# Patient Record
Sex: Female | Born: 1995 | Race: Black or African American | Hispanic: No | Marital: Single | Smoking: Never smoker
Health system: Southern US, Community
[De-identification: ages and names within clinical notes are randomized; demographics above are authoritative.]

## PROBLEM LIST (undated history)

## (undated) ENCOUNTER — Ambulatory Visit: Admission: EM | Source: Home / Self Care

## (undated) DIAGNOSIS — Z789 Other specified health status: Secondary | ICD-10-CM

## (undated) HISTORY — PX: NO PAST SURGERIES: SHX2092

## (undated) HISTORY — DX: Other specified health status: Z78.9

---
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
## 2018-12-01 ENCOUNTER — Other Ambulatory Visit: Payer: Self-pay

## 2018-12-01 DIAGNOSIS — Z20822 Contact with and (suspected) exposure to covid-19: Secondary | ICD-10-CM

## 2018-12-06 LAB — NOVEL CORONAVIRUS, NAA: SARS-CoV-2, NAA: NOT DETECTED

## 2019-06-15 ENCOUNTER — Other Ambulatory Visit: Payer: Self-pay

## 2019-06-15 ENCOUNTER — Ambulatory Visit
Admission: EM | Admit: 2019-06-15 | Discharge: 2019-06-15 | Disposition: A | Discharge status: Home / Self Care | Payer: Self-pay | Attending: Emergency Medicine | Admitting: Emergency Medicine

## 2019-06-15 DIAGNOSIS — Z20822 Contact with and (suspected) exposure to covid-19: Secondary | ICD-10-CM

## 2019-06-15 NOTE — ED Provider Notes (Signed)
[facility]   [id number] 06/15/19 Arrival Time: 1743   CC: COVID exposure  SUBJECTIVE: History from: patient.  Brittany Mayo is a 24 y.o. female who presents for COVID testing.  Admits to positive exposure at home.  Denies recent travel.  Denies aggravating or alleviating symptoms.  Denies previous COVID infection.   Denies fever, chills, fatigue, nasal congestion, rhinorrhea, sore throat, cough, SOB, wheezing, chest pain, nausea, vomiting, changes in bowel or bladder habits.    ROS: As per HPI.  All other pertinent ROS negative.     History reviewed. No pertinent past medical history. History reviewed. No pertinent surgical history. Allergies  Allergen Reactions  . Peanut-Containing Drug Products Hives and Rash   No current facility-administered medications on file prior to encounter.   Current Outpatient Medications on File Prior to Encounter  Medication Sig Dispense Refill  . ibuprofen (ADVIL,MOTRIN) 600 MG tablet Take 1 tablet (600 mg total) by mouth every 6 (six) hours as needed for pain. 30 tablet 0   Social History   Socioeconomic History  . Marital status: Single    Spouse name: Not on file  . Number of children: Not on file  . Years of education: Not on file  . Highest education level: Not on file  Occupational History  . Not on file  Tobacco Use  . Smoking status: Never Smoker  . Smokeless tobacco: Never Used  Substance and Sexual Activity  . Alcohol use: No  . Drug use: No  . Sexual activity: Not on file  Other Topics Concern  . Not on file  Social History Narrative  . Not on file   Social Determinants of Health   Financial Resource Strain:   . Difficulty of Paying Living Expenses: Not on file  Food Insecurity:   . Worried About Programme researcher, broadcasting/film/video in the Last Year: Not on file  . Ran Out of Food in the Last Year: Not on file  Transportation Needs:   . Lack of Transportation (Medical): Not on file  . Lack of Transportation (Non-Medical):  Not on file  Physical Activity:   . Days of Exercise per Week: Not on file  . Minutes of Exercise per Session: Not on file  Stress:   . Feeling of Stress : Not on file  Social Connections:   . Frequency of Communication with Friends and Family: Not on file  . Frequency of Social Gatherings with Friends and Family: Not on file  . Attends Religious Services: Not on file  . Active Member of Clubs or Organizations: Not on file  . Attends Banker Meetings: Not on file  . Marital Status: Not on file  Intimate Partner Violence:   . Fear of Current or Ex-Partner: Not on file  . Emotionally Abused: Not on file  . Physically Abused: Not on file  . Sexually Abused: Not on file   Family History  Problem Relation Age of Onset  . Healthy Mother   . Healthy Father     OBJECTIVE:  Vitals:   06/15/19 1800  BP: 123/84  Pulse: 80  Resp: 16  Temp: 99.2 F (37.3 C)  TempSrc: Oral  SpO2: 97%    General appearance: alert; well-appearing, nontoxic; speaking in full sentences and tolerating own secretions HEENT: NCAT; Ears: EACs clear, TMs pearly gray; Eyes: PERRL.  EOM grossly intact.Nose: nares patent without rhinorrhea, Throat: oropharynx clear, tonsils non erythematous or enlarged, uvula midline  Neck: supple without LAD Lungs: unlabored respirations, symmetrical  air entry; cough: absent; no respiratory distress; CTAB Heart: regular rate and rhythm.  Skin: warm and dry Psychological: alert and cooperative; normal mood and affect  ASSESSMENT & PLAN:  1. Exposure to COVID-19 virus   2. Suspected COVID-19 virus infection    COVID testing ordered.  It will take between 5-7 days for test results.  Someone will contact you regarding abnormal results.    In the meantime: You should remain isolated in your home for 10 days from symptom onset AND greater than 72 hours after symptoms resolution (absence of fever without the use of fever-reducing medication and improvement in  respiratory symptoms), whichever is longer OR 14 days from exposure Get plenty of rest and push fluids Use OTC zyrtec for nasal congestion, runny nose, and/or sore throat Use OTC flonase for nasal congestion and runny nose Use OTC medications like ibuprofen or tylenol as needed fever or pain Call or go to the ED if you have any new or worsening symptoms such as fever, cough, shortness of breath, chest tightness, chest pain, turning blue, changes in mental status, etc...   Reviewed expectations re: course of current medical issues. Questions answered. Outlined signs and symptoms indicating need for more acute intervention. Patient verbalized understanding. After Visit Summary given.         Lestine Box, PA-C 06/15/19 1825

## 2019-06-15 NOTE — ED Triage Notes (Signed)
Pt presents to [facility] for covid test. Pt states she was exposed to covid positive brothers who live in same house. Pt denies symptoms at this time.

## 2019-06-15 NOTE — Discharge Instructions (Signed)
COVID testing ordered.  It will take between 5-7 days for test results.  Someone will contact you regarding abnormal results.   ° °In the meantime: °You should remain isolated in your home for 10 days from symptom onset AND greater than 72 hours after symptoms resolution (absence of fever without the use of fever-reducing medication and improvement in respiratory symptoms), whichever is longer °OR 14 days from exposure °Get plenty of rest and push fluids °Use OTC zyrtec for nasal congestion, runny nose, and/or sore throat °Use OTC flonase for nasal congestion and runny nose °Use OTC medications like ibuprofen or tylenol as needed fever or pain °Call or go to the ED if you have any new or worsening symptoms such as fever, cough, shortness of breath, chest tightness, chest pain, turning blue, changes in mental status, etc...  °

## 2019-06-16 LAB — NOVEL CORONAVIRUS, NAA: SARS-CoV-2, NAA: NOT DETECTED

## 2020-02-18 ENCOUNTER — Encounter: Payer: Self-pay | Admitting: Advanced Practice Midwife

## 2020-02-21 ENCOUNTER — Encounter: Payer: Self-pay | Admitting: Women's Health

## 2020-02-21 ENCOUNTER — Other Ambulatory Visit: Payer: Self-pay

## 2020-02-21 ENCOUNTER — Ambulatory Visit (INDEPENDENT_AMBULATORY_CARE_PROVIDER_SITE_OTHER): Payer: No Typology Code available for payment source | Admitting: Women's Health

## 2020-02-21 VITALS — BP 126/82 | HR 86 | Ht 64.0 in | Wt 211.6 lb

## 2020-02-21 DIAGNOSIS — Z3202 Encounter for pregnancy test, result negative: Secondary | ICD-10-CM | POA: Diagnosis not present

## 2020-02-21 DIAGNOSIS — Z30011 Encounter for initial prescription of contraceptive pills: Secondary | ICD-10-CM | POA: Diagnosis not present

## 2020-02-21 LAB — POCT URINE PREGNANCY: Preg Test, Ur: NEGATIVE

## 2020-02-21 MED ORDER — LO LOESTRIN FE 1 MG-10 MCG / 10 MCG PO TABS
1.0000 | ORAL_TABLET | Freq: Every day | ORAL | 3 refills | Status: DC
Start: 2020-02-21 — End: 2020-04-21

## 2020-02-21 NOTE — Progress Notes (Signed)
   GYN VISIT Patient name: Brittany Mayo MRN [id number]  Date of birth: March 17, 1996 Chief Complaint:   Discuss Birth Control  History of Present Illness:   Brittany Mayo is a 24 y.o. G0P0000 African American female being seen today to discuss getting on birth control. Wants to do pills, has done in the past and did well w/ them. Does not smoke, no h/o HTN, DVT/PE, CVA, MI, or migraines w/ aura. First bp was elevated today, normal on repeat.     Depression screen [facility] 2/9 02/21/2020  Decreased Interest 0  Down, Depressed, Hopeless 0  PHQ - 2 Score 0  Altered sleeping 1  Tired, decreased energy 1  Change in appetite 0  Feeling bad or failure about yourself  0  Trouble concentrating 0  Moving slowly or fidgety/restless 0  Suicidal thoughts 0  PHQ-9 Score 2  Difficult doing work/chores Not difficult at all    Patient's last menstrual period was 02/01/2020 (exact date). The current method of family planning is none.  Last pap never. Results were:  n/a Review of Systems:   Pertinent items are noted in HPI Denies fever/chills, dizziness, headaches, visual disturbances, fatigue, shortness of breath, chest pain, abdominal pain, vomiting, abnormal vaginal discharge/itching/odor/irritation, problems with periods, bowel movements, urination, or intercourse unless otherwise stated above.  Pertinent History Reviewed:  Reviewed past medical,surgical, social, obstetrical and family history.  Reviewed problem list, medications and allergies. Physical Assessment:   Vitals:   02/21/20 1155 02/21/20 1200  BP: (!) 142/73 126/82  Pulse: 84 86  Weight: 211 lb 9.6 oz (96 kg)   Height: 5\' 4"  (1.626 m)   Body mass index is 36.32 kg/m.       Physical Examination:   General appearance: alert, well appearing, and in no distress  Mental status: alert, oriented to person, place, and time  Skin: warm & dry   Cardiovascular: normal heart rate noted  Respiratory: normal respiratory effort, no  distress  Abdomen: soft, non-tender   Pelvic: examination not indicated  Extremities: no edema   Chaperone: n/a    Results for orders placed or performed in visit on 02/21/20 (from the past 24 hour(s))  POCT urine pregnancy   Collection Time: 02/21/20 11:59 AM  Result Value Ref Range   Preg Test, Ur Negative Negative    Assessment & Plan:  1) Contraception management> rx LoLoestrin 3pk w/ 3RF, gave 3 sample packs, condoms x 2wks, f/u in 02/23/20  2) Needs pap> will do at med check up  Meds:  Meds ordered this encounter  Medications  . LO LOESTRIN FE 1 MG-10 MCG / 10 MCG tablet    Sig: Take 1 tablet by mouth daily.    Dispense:  90 tablet    Refill:  3    For co-pay card, pt to text "Lo Loestrin Fe " to [phone]              Co-pay card must be run in second position  "other coverage code 3"  if denied d/t PA, step edit, or insurance denial    Order Specific Question:   Supervising Provider    Answer:   [id number] H [[id number]]    Orders Placed This Encounter  Procedures  . POCT urine pregnancy    Return in about 3 months (around 05/22/2020) for Pap & physical.  05/24/2020 CNM, [facility] 02/21/2020 12:09 PM

## 2020-04-21 ENCOUNTER — Other Ambulatory Visit: Payer: Self-pay

## 2020-04-21 ENCOUNTER — Ambulatory Visit (INDEPENDENT_AMBULATORY_CARE_PROVIDER_SITE_OTHER): Payer: No Typology Code available for payment source

## 2020-04-21 VITALS — BP 153/83 | HR 83 | Ht 64.0 in | Wt 220.0 lb

## 2020-04-21 DIAGNOSIS — N926 Irregular menstruation, unspecified: Secondary | ICD-10-CM

## 2020-04-21 LAB — POCT URINE PREGNANCY: Preg Test, Ur: POSITIVE — AB

## 2020-04-21 NOTE — Progress Notes (Addendum)
   NURSE VISIT- PREGNANCY CONFIRMATION   SUBJECTIVE:  Brittany Mayo is a 24 y.o. G1P0000 female at [redacted]w[redacted]d by certain LMP of Patient's last menstrual period was 03/02/2020 (exact date). Here for pregnancy confirmation.  Home pregnancy test: positive x 4  She reports nausea.  She is taking prenatal vitamins.    OBJECTIVE:  BP (!) 153/83 (BP Location: Left Arm, Patient Position: Sitting, Cuff Size: Normal)   Pulse 83   Ht 5\' 4"  (1.626 m)   Wt 220 lb (99.8 kg)   LMP 03/02/2020 (Exact Date)   BMI 37.76 kg/m   Appears well, in no apparent distress OB History  Gravida Para Term Preterm AB Living  1 0 0 0 0 0  SAB TAB Ectopic Multiple Live Births  0 0 0 0 0    # Outcome Date GA Lbr Len/2nd Weight Sex Delivery Anes PTL Lv  1 Current             Results for orders placed or performed in visit on 04/21/20 (from the past 24 hour(s))  POCT urine pregnancy   Collection Time: 04/21/20 10:18 AM  Result Value Ref Range   Preg Test, Ur Positive (A) Negative    ASSESSMENT: Positive pregnancy test, [redacted]w[redacted]d by LMP    PLAN: Schedule for dating ultrasound in 1 week Prenatal vitamins: continue   Nausea medicines: not currently needed   OB packet given: Yes  Solei Wubben A Ysidra Sopher  04/21/2020 10:21 AM   Attestation of Attending Supervision of Nursing Visit Encounter: Evaluation and management procedures were performed by the nursing staff under my supervision and collaboration.  I have reviewed the nurse's note and chart, and I agree with the management and plan.  04/23/2020 MD Attending Physician for the Center for [facility] 04/23/2020 10:46 AM

## 2020-04-24 ENCOUNTER — Other Ambulatory Visit: Payer: Self-pay

## 2020-04-24 ENCOUNTER — Other Ambulatory Visit: Payer: Self-pay | Admitting: Obstetrics & Gynecology

## 2020-04-24 ENCOUNTER — Ambulatory Visit (INDEPENDENT_AMBULATORY_CARE_PROVIDER_SITE_OTHER): Payer: No Typology Code available for payment source

## 2020-04-24 DIAGNOSIS — Z3A01 Less than 8 weeks gestation of pregnancy: Secondary | ICD-10-CM | POA: Diagnosis not present

## 2020-04-24 DIAGNOSIS — O3680X0 Pregnancy with inconclusive fetal viability, not applicable or unspecified: Secondary | ICD-10-CM

## 2020-04-24 NOTE — Progress Notes (Signed)
[place] 6+3 wks,single IUP with YS,crl 6.02 mm,fhr 125 bpm,normal ovaries

## 2020-05-07 ENCOUNTER — Other Ambulatory Visit: Payer: No Typology Code available for payment source

## 2020-05-24 NOTE — L&D Delivery Note (Signed)
OB/GYN Faculty Practice Delivery Note  Brittany Mayo is a 25 y.o. G1P0000 s/p SVD at [redacted]w[redacted]d. She was admitted for IOL for gHTN.   ROM: 3h 43m with clear fluid GBS Status:  Negative/-- (07/04 0000) Maximum Maternal Temperature: 98.2  Labor Progress: Initial SVE: 2cm. She had negative CST and was uptitrated on pitocin. AROM for clear fluid. She then progressed to complete.   Delivery Date/Time: 21:37 on 7/4  Delivery: Called to room and patient was complete and pushing. Head delivered LOA. No nuchal cord present. Shoulder and body delivered in usual fashion. Infant with spontaneous cry, placed on mother's abdomen, dried and stimulated. Cord clamped x 2 after 1-minute delay, and cut by patient's best friend. Cord blood drawn. Placenta delivered spontaneously with gentle cord traction. Fundus firm with massage and Pitocin. Labia, perineum, vagina, and cervix inspected inspected with second degree laceration and right labial laceration, repaired. Due to persistent oozing from laceration, TXA given and vagina packed with one lap. On reassessment bleeding had improved. Arrista placed over laceration.  Baby Weight: pending  Placenta: Sent to L&D Complications: None Lacerations: Second degree perineal, right labial, both repaired.  EBL: 200  mL Analgesia: Epidural   Infant:  APGAR (1 MIN): 8   APGAR (5 MINS): 9   APGAR (10 MINS):     Casper Harrison, MD [facility] Family Medicine Fellow, [facility] for [facility], [facility] 11/24/2020, 10:16 PM

## 2020-06-02 ENCOUNTER — Other Ambulatory Visit: Payer: Self-pay | Admitting: Women's Health

## 2020-06-02 ENCOUNTER — Other Ambulatory Visit: Payer: Self-pay | Admitting: Obstetrics & Gynecology

## 2020-06-02 DIAGNOSIS — Z3682 Encounter for antenatal screening for nuchal translucency: Secondary | ICD-10-CM

## 2020-06-03 ENCOUNTER — Other Ambulatory Visit: Payer: Self-pay

## 2020-06-03 ENCOUNTER — Ambulatory Visit (INDEPENDENT_AMBULATORY_CARE_PROVIDER_SITE_OTHER): Payer: No Typology Code available for payment source | Admitting: Women's Health

## 2020-06-03 ENCOUNTER — Ambulatory Visit (INDEPENDENT_AMBULATORY_CARE_PROVIDER_SITE_OTHER): Payer: No Typology Code available for payment source

## 2020-06-03 ENCOUNTER — Ambulatory Visit: Payer: No Typology Code available for payment source | Admitting: *Deleted

## 2020-06-03 ENCOUNTER — Other Ambulatory Visit: Payer: No Typology Code available for payment source

## 2020-06-03 ENCOUNTER — Encounter: Payer: Self-pay | Admitting: Women's Health

## 2020-06-03 VITALS — BP 126/77 | HR 88 | Wt 210.0 lb

## 2020-06-03 DIAGNOSIS — Z3A12 12 weeks gestation of pregnancy: Secondary | ICD-10-CM

## 2020-06-03 DIAGNOSIS — Z1389 Encounter for screening for other disorder: Secondary | ICD-10-CM

## 2020-06-03 DIAGNOSIS — Z3401 Encounter for supervision of normal first pregnancy, first trimester: Secondary | ICD-10-CM

## 2020-06-03 DIAGNOSIS — O0990 Supervision of high risk pregnancy, unspecified, unspecified trimester: Secondary | ICD-10-CM | POA: Insufficient documentation

## 2020-06-03 DIAGNOSIS — Z3682 Encounter for antenatal screening for nuchal translucency: Secondary | ICD-10-CM | POA: Diagnosis not present

## 2020-06-03 DIAGNOSIS — Z331 Pregnant state, incidental: Secondary | ICD-10-CM

## 2020-06-03 DIAGNOSIS — Z3400 Encounter for supervision of normal first pregnancy, unspecified trimester: Secondary | ICD-10-CM | POA: Insufficient documentation

## 2020-06-03 LAB — POCT URINALYSIS DIPSTICK OB
Blood, UA: NEGATIVE
Glucose, UA: NEGATIVE
Ketones, UA: NEGATIVE
Leukocytes, UA: NEGATIVE
Nitrite, UA: NEGATIVE
POC,PROTEIN,UA: NEGATIVE

## 2020-06-03 MED ORDER — ASPIRIN 81 MG PO TBEC: 81.0000 mg | DELAYED_RELEASE_TABLET | Freq: Every day | ORAL | 3 refills | Status: DC

## 2020-06-03 NOTE — Progress Notes (Signed)
INITIAL OBSTETRICAL VISIT Patient name: Brittany Mayo MRN [id number]  Date of birth: 17-Apr-1996 Chief Complaint:   Initial Prenatal Visit (Nt/it)  History of Present Illness:   Brittany Mayo is a 25 y.o. G1P0000 African American female at [redacted]w[redacted]d by [place] at 6 weeks with an Estimated Date of Delivery: 12/15/20 being seen today for her initial obstetrical visit.   Her obstetrical history is significant for primigravida.   Today she reports n/v- declines meds.  Depression screen [facility] 2/9 06/03/2020 02/21/2020  Decreased Interest 1 0  Down, Depressed, Hopeless 0 0  PHQ - 2 Score 1 0  Altered sleeping 0 1  Tired, decreased energy 1 1  Change in appetite 0 0  Feeling bad or failure about yourself  0 0  Trouble concentrating 0 0  Moving slowly or fidgety/restless 0 0  Suicidal thoughts 0 0  PHQ-9 Score 2 2  Difficult doing work/chores - Not difficult at all    Patient's last menstrual period was 03/02/2020 (exact date). Last pap never. Results were: n/a Review of Systems:   Pertinent items are noted in HPI Denies cramping/contractions, leakage of fluid, vaginal bleeding, abnormal vaginal discharge w/ itching/odor/irritation, headaches, visual changes, shortness of breath, chest pain, abdominal pain, severe nausea/vomiting, or problems with urination or bowel movements unless otherwise stated above.  Pertinent History Reviewed:  Reviewed past medical,surgical, social, obstetrical and family history.  Reviewed problem list, medications and allergies. OB History  Gravida Para Term Preterm AB Living  1 0 0 0 0 0  SAB IAB Ectopic Multiple Live Births  0 0 0 0 0    # Outcome Date GA Lbr Len/2nd Weight Sex Delivery Anes PTL Lv  1 Current            Physical Assessment:   Vitals:   06/03/20 0931  BP: 126/77  Pulse: 88  Weight: 210 lb (95.3 kg)  Body mass index is 36.05 kg/m.       Physical Examination:  General appearance - well appearing, and in no distress  Mental status - alert,  oriented to person, place, and time  Psych:  She has a normal mood and affect  Skin - warm and dry, normal color, no suspicious lesions noted  Chest - effort normal, all lung fields clear to auscultation bilaterally  Heart - normal rate and regular rhythm  Abdomen - soft, nontender  Extremities:  No swelling or varicosities noted  Thin prep pap is not done-wants to do next visit  Chaperone: N/A    TODAY'S NT [place] 12+2 wks,measurements c/w dates,crl 58.29 mm,normal ovaries,FHR 161 bpm,anterior placenta,NB present, NT 1.5 mm  Results for orders placed or performed in visit on 06/03/20 (from the past 24 hour(s))  POC Urinalysis Dipstick OB   Collection Time: 06/03/20  9:59 AM  Result Value Ref Range   Color, UA     Clarity, UA     Glucose, UA Negative Negative   Bilirubin, UA     Ketones, UA neg    Spec Grav, UA     Blood, UA neg    pH, UA     POC,PROTEIN,UA Negative Negative, Trace, Small (1+), Moderate (2+), Large (3+), 4+   Urobilinogen, UA     Nitrite, UA neg    Leukocytes, UA Negative Negative   Appearance     Odor      Assessment & Plan:  1) Low-Risk Pregnancy G1P0000 at [redacted]w[redacted]d with an Estimated Date of Delivery: 12/15/20   2) Initial  OB visit  3) N/V> declines meds  Meds:  Meds ordered this encounter  Medications  . aspirin 81 MG EC tablet    Sig: Take 1 tablet (81 mg total) by mouth daily. Swallow whole.    Dispense:  90 tablet    Refill:  3    Order Specific Question:   Supervising Provider    Answer:   Duane Lope H [[id number]]    Initial labs obtained Continue prenatal vitamins Reviewed n/v relief measures and warning s/s to report Reviewed recommended weight gain based on pre-gravid BMI Encouraged well-balanced diet Genetic & carrier screening discussed: requests Panorama and NT/IT, undecided about Horizon 14  Ultrasound discussed; fetal survey: requested CCNC completed> form faxed if has or is planning to apply for medicaid The nature of [place] [facility]  [facility] for [employer] with multiple MDs and other Advanced Practice Providers was explained to patient; also emphasized that fellows, residents, and students are part of our team. Has home bp cuff. Check bp weekly, let us know if >140/90.   Indications for ASA therapy (per uptodate) OR Two or more of the following: Nulliparity Yes Obesity (BMI>30 kg/m2) Yes  Sociodemographic characteristics (African American race, low socioeconomic level) Yes   Follow-up: Return in about 3 weeks (around 06/24/2020) for LROB, 2nd IT, in person, CNM.   Orders Placed This Encounter  Procedures  . Urine Culture  . GC/Chlamydia Probe Amp  . Pain Management Screening Profile (10S)  . CBC/D/Plt+RPR+Rh+ABO+Rub Ab...  . POC Urinalysis Dipstick OB    Cheral Marker CNM, [facility] 06/03/2020 10:57 AM

## 2020-06-03 NOTE — Progress Notes (Signed)
[place] 12+1 wks,measurements c/w dates,crl 58.29 mm,normal ovaries,anterior placenta gr 0,fhr 161 bpm,NB present, NT 1.5 mm

## 2020-06-03 NOTE — Patient Instructions (Signed)
Brittany Mayo, I greatly value your feedback.  If you receive a survey following your visit with [place] today, we appreciate you taking the time to fill it out.  Thanks, Brittany Mayo, CNM, WHNP-BC   [facility] ([address] [place], [place] [address]) Entrance C, located off of [address] Free 24/7 valet parking   Nausea & Vomiting  Have saltine crackers or pretzels by your bed and eat a few bites before you raise your head out of bed in the morning  Eat small frequent meals throughout the day instead of large meals  Drink plenty of fluids throughout the day to stay hydrated, just don't drink a lot of fluids with your meals.  This can make your stomach fill up faster making you feel sick  Do not brush your teeth right after you eat  Products with real ginger are good for nausea, like ginger ale and ginger hard candy Make sure it says made with real ginger!  Sucking on sour candy like lemon heads is also good for nausea  If your prenatal vitamins make you nauseated, take them at night so you will sleep through the nausea  Sea Bands  If you feel like you need medicine for the nausea & vomiting please let us know  If you are unable to keep any fluids or food down please let us know   Constipation  Drink plenty of fluid, preferably water, throughout the day  Eat foods high in fiber such as fruits, vegetables, and grains  Exercise, such as walking, is a good way to keep your bowels regular  Drink warm fluids, especially warm prune juice, or decaf coffee  Eat a 1/2 cup of real oatmeal (not instant), 1/2 cup applesauce, and 1/2-1 cup warm prune juice every day  If needed, you may take Colace (docusate sodium) stool softener once or twice a day to help keep the stool soft.   If you still are having problems with constipation, you may take Miralax once daily as needed to help keep your bowels regular.   Home Blood Pressure Monitoring for Patients    Your provider has recommended that you check your blood pressure (BP) at least once a week at home. If you do not have a blood pressure cuff at home, one will be provided for you. Contact your provider if you have not received your monitor within 1 week.   Helpful Tips for Accurate Home Blood Pressure Checks  . Don't smoke, exercise, or drink caffeine 30 minutes before checking your BP . Use the restroom before checking your BP (a full bladder can raise your pressure) . Relax in a comfortable upright chair . Feet on the ground . Left arm resting comfortably on a flat surface at the level of your heart . Legs uncrossed . Back supported . Sit quietly and don't talk . Place the cuff on your bare arm . Adjust snuggly, so that only two fingertips can fit between your skin and the top of the cuff . Check 2 readings separated by at least one minute . Keep a log of your BP readings . For a visual, please reference this diagram: http://ccnc.care/bpdiagram  Provider Name: [facility]     Phone: [phone]  Zone 1: ALL CLEAR  Continue to monitor your symptoms:  . BP reading is less than 140 (top number) or less than 90 (bottom number)  . No right upper stomach pain . No headaches or  seeing spots . No feeling nauseated or throwing up . No swelling in face and hands  Zone 2: CAUTION Call your doctor's office for any of the following:  . BP reading is greater than 140 (top number) or greater than 90 (bottom number)  . Stomach pain under your ribs in the middle or right side . Headaches or seeing spots . Feeling nauseated or throwing up . Swelling in face and hands  Zone 3: EMERGENCY  Seek immediate medical care if you have any of the following:  . BP reading is greater than160 (top number) or greater than 110 (bottom number) . Severe headaches not improving with Tylenol . Serious difficulty catching your breath . Any worsening symptoms from Zone 2    First Trimester of  Pregnancy The first trimester of pregnancy is from week 1 until the end of week 12 (months 1 through 3). A week after a sperm fertilizes an egg, the egg will implant on the wall of the uterus. This embryo will begin to develop into a baby. Genes from you and your partner are forming the baby. The female genes determine whether the baby is a boy or a girl. At 6-8 weeks, the eyes and face are formed, and the heartbeat can be seen on ultrasound. At the end of 12 weeks, all the baby's organs are formed.  Now that you are pregnant, you will want to do everything you can to have a healthy baby. Two of the most important things are to get good prenatal care and to follow your health care provider's instructions. Prenatal care is all the medical care you receive before the baby's birth. This care will help prevent, find, and treat any problems during the pregnancy and childbirth. BODY CHANGES Your body goes through many changes during pregnancy. The changes vary from woman to woman.   You may gain or lose a couple of pounds at first.  You may feel sick to your stomach (nauseous) and throw up (vomit). If the vomiting is uncontrollable, call your health care provider.  You may tire easily.  You may develop headaches that can be relieved by medicines approved by your health care provider.  You may urinate more often. Painful urination may mean you have a bladder infection.  You may develop heartburn as a result of your pregnancy.  You may develop constipation because certain hormones are causing the muscles that push waste through your intestines to slow down.  You may develop hemorrhoids or swollen, bulging veins (varicose veins).  Your breasts may begin to grow larger and become tender. Your nipples may stick out more, and the tissue that surrounds them (areola) may become darker.  Your gums may bleed and may be sensitive to brushing and flossing.  Dark spots or blotches (chloasma, mask of pregnancy)  may develop on your face. This will likely fade after the baby is born.  Your menstrual periods will stop.  You may have a loss of appetite.  You may develop cravings for certain kinds of food.  You may have changes in your emotions from day to day, such as being excited to be pregnant or being concerned that something may go wrong with the pregnancy and baby.  You may have more vivid and strange dreams.  You may have changes in your hair. These can include thickening of your hair, rapid growth, and changes in texture. Some women also have hair loss during or after pregnancy, or hair that feels dry or thin. Your  hair will most likely return to normal after your baby is born. WHAT TO EXPECT AT YOUR PRENATAL VISITS During a routine prenatal visit:  You will be weighed to make sure you and the baby are growing normally.  Your blood pressure will be taken.  Your abdomen will be measured to track your baby's growth.  The fetal heartbeat will be listened to starting around week 10 or 12 of your pregnancy.  Test results from any previous visits will be discussed. Your health care provider may ask you:  How you are feeling.  If you are feeling the baby move.  If you have had any abnormal symptoms, such as leaking fluid, bleeding, severe headaches, or abdominal cramping.  If you have any questions. Other tests that may be performed during your first trimester include:  Blood tests to find your blood type and to check for the presence of any previous infections. They will also be used to check for low iron levels (anemia) and Rh antibodies. Later in the pregnancy, blood tests for diabetes will be done along with other tests if problems develop.  Urine tests to check for infections, diabetes, or protein in the urine.  An ultrasound to confirm the proper growth and development of the baby.  An amniocentesis to check for possible genetic problems.  Fetal screens for spina bifida and  Down syndrome.  You may need other tests to make sure you and the baby are doing well. HOME CARE INSTRUCTIONS  Medicines  Follow your health care provider's instructions regarding medicine use. Specific medicines may be either safe or unsafe to take during pregnancy.  Take your prenatal vitamins as directed.  If you develop constipation, try taking a stool softener if your health care provider approves. Diet  Eat regular, well-balanced meals. Choose a variety of foods, such as meat or vegetable-based protein, fish, milk and low-fat dairy products, vegetables, fruits, and whole grain breads and cereals. Your health care provider will help you determine the amount of weight gain that is right for you.  Avoid raw meat and uncooked cheese. These carry germs that can cause birth defects in the baby.  Eating four or five small meals rather than three large meals a day may help relieve nausea and vomiting. If you start to feel nauseous, eating a few soda crackers can be helpful. Drinking liquids between meals instead of during meals also seems to help nausea and vomiting.  If you develop constipation, eat more high-fiber foods, such as fresh vegetables or fruit and whole grains. Drink enough fluids to keep your urine clear or pale yellow. Activity and Exercise  Exercise only as directed by your health care provider. Exercising will help you:  Control your weight.  Stay in shape.  Be prepared for labor and delivery.  Experiencing pain or cramping in the lower abdomen or low back is a good sign that you should stop exercising. Check with your health care provider before continuing normal exercises.  Try to avoid standing for long periods of time. Move your legs often if you must stand in one place for a long time.  Avoid heavy lifting.  Wear low-heeled shoes, and practice good posture.  You may continue to have sex unless your health care provider directs you otherwise. Relief of Pain  or Discomfort  Wear a good support bra for breast tenderness.    Take warm sitz baths to soothe any pain or discomfort caused by hemorrhoids. Use hemorrhoid cream if your health care provider  approves.    Rest with your legs elevated if you have leg cramps or low back pain.  If you develop varicose veins in your legs, wear support hose. Elevate your feet for 15 minutes, 3-4 times a day. Limit salt in your diet. Prenatal Care  Schedule your prenatal visits by the twelfth week of pregnancy. They are usually scheduled monthly at first, then more often in the last 2 months before delivery.  Write down your questions. Take them to your prenatal visits.  Keep all your prenatal visits as directed by your health care provider. Safety  Wear your seat belt at all times when driving.  Make a list of emergency phone numbers, including numbers for family, friends, the hospital, and police and fire departments. General Tips  Ask your health care provider for a referral to a local prenatal education class. Begin classes no later than at the beginning of month 6 of your pregnancy.  Ask for help if you have counseling or nutritional needs during pregnancy. Your health care provider can offer advice or refer you to specialists for help with various needs.  Do not use hot tubs, steam rooms, or saunas.  Do not douche or use tampons or scented sanitary pads.  Do not cross your legs for long periods of time.  Avoid cat litter boxes and soil used by cats. These carry germs that can cause birth defects in the baby and possibly loss of the fetus by miscarriage or stillbirth.  Avoid all smoking, herbs, alcohol, and medicines not prescribed by your health care provider. Chemicals in these affect the formation and growth of the baby.  Schedule a dentist appointment. At home, brush your teeth with a soft toothbrush and be gentle when you floss. SEEK MEDICAL CARE IF:   You have dizziness.  You have mild  pelvic cramps, pelvic pressure, or nagging pain in the abdominal area.  You have persistent nausea, vomiting, or diarrhea.  You have a bad smelling vaginal discharge.  You have pain with urination.  You notice increased swelling in your face, hands, legs, or ankles. SEEK IMMEDIATE MEDICAL CARE IF:   You have a fever.  You are leaking fluid from your vagina.  You have spotting or bleeding from your vagina.  You have severe abdominal cramping or pain.  You have rapid weight gain or loss.  You vomit blood or material that looks like coffee grounds.  You are exposed to [place] measles and have never had them.  You are exposed to fifth disease or chickenpox.  You develop a severe headache.  You have shortness of breath.  You have any kind of trauma, such as from a fall or a car accident. Document Released: 05/04/2001 Document Revised: 09/24/2013 Document Reviewed: 03/20/2013 [facility] Patient Information 2015 [place], [place]. This information is not intended to replace advice given to you by your health care provider. Make sure you discuss any questions you have with your health care provider.

## 2020-06-04 LAB — PMP SCREEN PROFILE (10S), URINE
Amphetamine Scrn, Ur: NEGATIVE ng/mL
BARBITURATE SCREEN URINE: NEGATIVE ng/mL
BENZODIAZEPINE SCREEN, URINE: NEGATIVE ng/mL
CANNABINOIDS UR QL SCN: NEGATIVE ng/mL
Cocaine (Metab) Scrn, Ur: NEGATIVE ng/mL
Creatinine(Crt), U: 153.3 mg/dL (ref 20.0–300.0)
Methadone Screen, Urine: NEGATIVE ng/mL
OXYCODONE+OXYMORPHONE UR QL SCN: NEGATIVE ng/mL
Opiate Scrn, Ur: NEGATIVE ng/mL
Ph of Urine: 6.4 (ref 4.5–8.9)
Phencyclidine Qn, Ur: NEGATIVE ng/mL
Propoxyphene Scrn, Ur: NEGATIVE ng/mL

## 2020-06-04 LAB — CBC/D/PLT+RPR+RH+ABO+RUB AB...
Antibody Screen: NEGATIVE
Basophils Absolute: 0 10*3/uL (ref 0.0–0.2)
Basos: 0 %
EOS (ABSOLUTE): 0.1 10*3/uL (ref 0.0–0.4)
Eos: 2 %
HCV Ab: <0.1 s/co ratio (ref 0.0–0.9)
HIV Screen 4th Generation wRfx: NONREACTIVE
Hematocrit: 38.4 % (ref 34.0–46.6)
Hemoglobin: 12.5 g/dL (ref 11.1–15.9)
Hepatitis B Surface Ag: NEGATIVE
Immature Grans (Abs): 0 10*3/uL (ref 0.0–0.1)
Immature Granulocytes: 0 %
Lymphocytes Absolute: 1.3 10*3/uL (ref 0.7–3.1)
Lymphs: 24 %
MCH: 27 pg (ref 26.6–33.0)
MCHC: 32.6 g/dL (ref 31.5–35.7)
MCV: 83 fL (ref 79–97)
Monocytes Absolute: 0.5 10*3/uL (ref 0.1–0.9)
Monocytes: 9 %
Neutrophils Absolute: 3.6 10*3/uL (ref 1.4–7.0)
Neutrophils: 65 %
Platelets: 267 10*3/uL (ref 150–450)
RBC: 4.63 x10E6/uL (ref 3.77–5.28)
RDW: 14.5 % (ref 11.7–15.4)
RPR Ser Ql: NONREACTIVE
Rh Factor: POSITIVE
Rubella Antibodies, IGG: 2.14 index (ref >0.99)
WBC: 5.6 10*3/uL (ref 3.4–10.8)

## 2020-06-04 LAB — HCV INTERPRETATION

## 2020-06-05 LAB — GC/CHLAMYDIA PROBE AMP
Chlamydia trachomatis, NAA: NEGATIVE
Neisseria Gonorrhoeae by PCR: NEGATIVE

## 2020-06-05 LAB — URINE CULTURE: Organism ID, Bacteria: NO GROWTH

## 2020-06-24 ENCOUNTER — Other Ambulatory Visit: Payer: Self-pay

## 2020-06-24 ENCOUNTER — Ambulatory Visit (INDEPENDENT_AMBULATORY_CARE_PROVIDER_SITE_OTHER): Payer: No Typology Code available for payment source | Admitting: Women's Health

## 2020-06-24 ENCOUNTER — Encounter: Payer: Self-pay | Admitting: Women's Health

## 2020-06-24 ENCOUNTER — Other Ambulatory Visit (HOSPITAL_COMMUNITY)
Admission: RE | Admit: 2020-06-24 | Discharge: 2020-06-24 | Disposition: A | Discharge status: Home / Self Care | Payer: No Typology Code available for payment source | Source: Ambulatory Visit | Attending: Obstetrics & Gynecology | Admitting: Obstetrics & Gynecology

## 2020-06-24 VITALS — BP 135/73 | HR 111 | Wt 206.4 lb

## 2020-06-24 DIAGNOSIS — Z01419 Encounter for gynecological examination (general) (routine) without abnormal findings: Secondary | ICD-10-CM

## 2020-06-24 DIAGNOSIS — Z3A15 15 weeks gestation of pregnancy: Secondary | ICD-10-CM

## 2020-06-24 DIAGNOSIS — Z3402 Encounter for supervision of normal first pregnancy, second trimester: Secondary | ICD-10-CM

## 2020-06-24 DIAGNOSIS — Z363 Encounter for antenatal screening for malformations: Secondary | ICD-10-CM

## 2020-06-24 NOTE — Addendum Note (Signed)
Addended by: Leilani Able, Casi Westerfeld A on: 06/24/2020 10:08 AM   Modules accepted: Orders

## 2020-06-24 NOTE — Patient Instructions (Signed)
Brittany Mayo, I greatly value your feedback.  If you receive a survey following your visit with [place] today, we appreciate you taking the time to fill it out.  Thanks, [name], CNM, WHNP-BC  [facility] ([address] [place], [place] [address]) Entrance C, located off of [address] valet parking  Go to [email or web address] to register for FREE online childbirth classes  [place] Pediatricians/Family Doctors:  [facility] [phone]            [facility] [phone]                 [facility] [phone] (usually not accepting new patients unless you have family there already, you are always welcome to call and ask)       [facility] Department [phone]       [facility] Pediatricians/Family Doctors:   [facility]: [phone]  [facility]: [phone]  [facility]: [phone]  [facility] Doctors:   [facility]: [phone]   [facility]: [phone]  [facility] Doctors:  [facility]: [phone]    Home Blood Pressure Monitoring for Patients   Your provider has recommended that you check your blood pressure (BP) at least once a week at home. If you do not have a blood pressure cuff at home, one will be provided for you. Contact your provider if you have not received your monitor within 1 week.   Helpful Tips for Accurate Home Blood Pressure Checks  . Don't smoke, exercise, or drink caffeine 30 minutes before checking your BP . Use the restroom before checking your BP (a full bladder can raise your pressure) . Relax in a comfortable upright chair . Feet on the ground . Left arm resting comfortably on a flat surface at the level of your heart . Legs uncrossed . Back supported . Sit quietly and don't talk . Place the cuff on your bare arm . Adjust snuggly, so  that only two fingertips can fit between your skin and the top of the cuff . Check 2 readings separated by at least one minute . Keep a log of your BP readings . For a visual, please reference this diagram: http://ccnc.care/bpdiagram  Provider Name: [facility]     Phone: [phone]  Zone 1: ALL CLEAR  Continue to monitor your symptoms:  . BP reading is less than 140 (top number) or less than 90 (bottom number)  . No right upper stomach pain . No headaches or seeing spots . No feeling nauseated or throwing up . No swelling in face and hands  Zone 2: CAUTION Call your doctor's office for any of the following:  . BP reading is greater than 140 (top number) or greater than 90 (bottom number)  . Stomach pain under your ribs in the middle or right side . Headaches or seeing spots . Feeling nauseated or throwing up . Swelling in face and hands  Zone 3: EMERGENCY  Seek immediate medical care if you have any of the following:  . BP reading is greater than160 (top number) or greater than 110 (bottom number) . Severe headaches not improving with Tylenol . Serious difficulty catching your breath . Any worsening symptoms from Zone 2     Second Trimester of Pregnancy The second trimester is from week 14 through week 27 (months 4 through 6). The second trimester is often a time when you feel your  best. Your body has adjusted to being pregnant, and you begin to feel better physically. Usually, morning sickness has lessened or quit completely, you may have more energy, and you may have an increase in appetite. The second trimester is also a time when the fetus is growing rapidly. At the end of the sixth month, the fetus is about 9 inches long and weighs about 1 pounds. You will likely begin to feel the baby move (quickening) between 16 and 20 weeks of pregnancy. Body changes during your second trimester Your body continues to go through many changes during your second trimester. The  changes vary from woman to woman.  Your weight will continue to increase. You will notice your lower abdomen bulging out.  You may begin to get stretch marks on your hips, abdomen, and breasts.  You may develop headaches that can be relieved by medicines. The medicines should be approved by your health care provider.  You may urinate more often because the fetus is pressing on your bladder.  You may develop or continue to have heartburn as a result of your pregnancy.  You may develop constipation because certain hormones are causing the muscles that push waste through your intestines to slow down.  You may develop hemorrhoids or swollen, bulging veins (varicose veins).  You may have back pain. This is caused by: ? Weight gain. ? Pregnancy hormones that are relaxing the joints in your pelvis. ? A shift in weight and the muscles that support your balance.  Your breasts will continue to grow and they will continue to become tender.  Your gums may bleed and may be sensitive to brushing and flossing.  Dark spots or blotches (chloasma, mask of pregnancy) may develop on your face. This will likely fade after the baby is born.  A dark line from your belly button to the pubic area (linea nigra) may appear. This will likely fade after the baby is born.  You may have changes in your hair. These can include thickening of your hair, rapid growth, and changes in texture. Some women also have hair loss during or after pregnancy, or hair that feels dry or thin. Your hair will most likely return to normal after your baby is born.  What to expect at prenatal visits During a routine prenatal visit:  You will be weighed to make sure you and the fetus are growing normally.  Your blood pressure will be taken.  Your abdomen will be measured to track your baby's growth.  The fetal heartbeat will be listened to.  Any test results from the previous visit will be discussed.  Your health care  provider may ask you:  How you are feeling.  If you are feeling the baby move.  If you have had any abnormal symptoms, such as leaking fluid, bleeding, severe headaches, or abdominal cramping.  If you are using any tobacco products, including cigarettes, chewing tobacco, and electronic cigarettes.  If you have any questions.  Other tests that may be performed during your second trimester include:  Blood tests that check for: ? Low iron levels (anemia). ? High blood sugar that affects pregnant women (gestational diabetes) between 71 and 28 weeks. ? Rh antibodies. This is to check for a protein on red blood cells (Rh factor).  Urine tests to check for infections, diabetes, or protein in the urine.  An ultrasound to confirm the proper growth and development of the baby.  An amniocentesis to check for possible genetic problems.  Fetal  screens for spina bifida and Down syndrome.  HIV (human immunodeficiency virus) testing. Routine prenatal testing includes screening for HIV, unless you choose not to have this test.  Follow these instructions at home: Medicines  Follow your health care provider's instructions regarding medicine use. Specific medicines may be either safe or unsafe to take during pregnancy.  Take a prenatal vitamin that contains at least 600 micrograms (mcg) of folic acid.  If you develop constipation, try taking a stool softener if your health care provider approves. Eating and drinking  Eat a balanced diet that includes fresh fruits and vegetables, whole grains, good sources of protein such as meat, eggs, or tofu, and low-fat dairy. Your health care provider will help you determine the amount of weight gain that is right for you.  Avoid raw meat and uncooked cheese. These carry germs that can cause birth defects in the baby.  If you have low calcium intake from food, talk to your health care provider about whether you should take a daily calcium  supplement.  Limit foods that are high in fat and processed sugars, such as fried and sweet foods.  To prevent constipation: ? Drink enough fluid to keep your urine clear or pale yellow. ? Eat foods that are high in fiber, such as fresh fruits and vegetables, whole grains, and beans. Activity  Exercise only as directed by your health care provider. Most women can continue their usual exercise routine during pregnancy. Try to exercise for 30 minutes at least 5 days a week. Stop exercising if you experience uterine contractions.  Avoid heavy lifting, wear low heel shoes, and practice good posture.  A sexual relationship may be continued unless your health care provider directs you otherwise. Relieving pain and discomfort  Wear a good support bra to prevent discomfort from breast tenderness.  Take warm sitz baths to soothe any pain or discomfort caused by hemorrhoids. Use hemorrhoid cream if your health care provider approves.  Rest with your legs elevated if you have leg cramps or low back pain.  If you develop varicose veins, wear support hose. Elevate your feet for 15 minutes, 3-4 times a day. Limit salt in your diet. Prenatal Care  Write down your questions. Take them to your prenatal visits.  Keep all your prenatal visits as told by your health care provider. This is important. Safety  Wear your seat belt at all times when driving.  Make a list of emergency phone numbers, including numbers for family, friends, the hospital, and police and fire departments. General instructions  Ask your health care provider for a referral to a local prenatal education class. Begin classes no later than the beginning of month 6 of your pregnancy.  Ask for help if you have counseling or nutritional needs during pregnancy. Your health care provider can offer advice or refer you to specialists for help with various needs.  Do not use hot tubs, steam rooms, or saunas.  Do not douche or use  tampons or scented sanitary pads.  Do not cross your legs for long periods of time.  Avoid cat litter boxes and soil used by cats. These carry germs that can cause birth defects in the baby and possibly loss of the fetus by miscarriage or stillbirth.  Avoid all smoking, herbs, alcohol, and unprescribed drugs. Chemicals in these products can affect the formation and growth of the baby.  Do not use any products that contain nicotine or tobacco, such as cigarettes and e-cigarettes. If you need help  quitting, ask your health care provider.  Visit your dentist if you have not gone yet during your pregnancy. Use a soft toothbrush to brush your teeth and be gentle when you floss. Contact a health care provider if:  You have dizziness.  You have mild pelvic cramps, pelvic pressure, or nagging pain in the abdominal area.  You have persistent nausea, vomiting, or diarrhea.  You have a bad smelling vaginal discharge.  You have pain when you urinate. Get help right away if:  You have a fever.  You are leaking fluid from your vagina.  You have spotting or bleeding from your vagina.  You have severe abdominal cramping or pain.  You have rapid weight gain or weight loss.  You have shortness of breath with chest pain.  You notice sudden or extreme swelling of your face, hands, ankles, feet, or legs.  You have not felt your baby move in over an hour.  You have severe headaches that do not go away when you take medicine.  You have vision changes. Summary  The second trimester is from week 14 through week 27 (months 4 through 6). It is also a time when the fetus is growing rapidly.  Your body goes through many changes during pregnancy. The changes vary from woman to woman.  Avoid all smoking, herbs, alcohol, and unprescribed drugs. These chemicals affect the formation and growth your baby.  Do not use any tobacco products, such as cigarettes, chewing tobacco, and e-cigarettes. If you  need help quitting, ask your health care provider.  Contact your health care provider if you have any questions. Keep all prenatal visits as told by your health care provider. This is important. This information is not intended to replace advice given to you by your health care provider. Make sure you discuss any questions you have with your health care provider. Document Released: 05/04/2001 Document Revised: 10/16/2015 Document Reviewed: 07/11/2012 Elsevier Interactive Patient Education  2017 [employer]   

## 2020-06-24 NOTE — Progress Notes (Signed)
   LOW-RISK PREGNANCY VISIT Patient name: Brittany Mayo MRN [id number]  Date of birth: 1996/04/21 Chief Complaint:   Routine Prenatal Visit  History of Present Illness:   Brittany Mayo is a 25 y.o. G1P0000 female at [redacted]w[redacted]d with an Estimated Date of Delivery: 12/15/20 being seen today for ongoing management of a low-risk pregnancy.  Depression screen [facility] 2/9 06/03/2020 02/21/2020  Decreased Interest 1 0  Down, Depressed, Hopeless 0 0  PHQ - 2 Score 1 0  Altered sleeping 0 1  Tired, decreased energy 1 1  Change in appetite 0 0  Feeling bad or failure about yourself  0 0  Trouble concentrating 0 0  Moving slowly or fidgety/restless 0 0  Suicidal thoughts 0 0  PHQ-9 Score 2 2  Difficult doing work/chores - Not difficult at all    Today she reports no complaints. Contractions: Not present. Vag. Bleeding: None.  Movement: Absent. denies leaking of fluid. Review of Systems:   Pertinent items are noted in HPI Denies abnormal vaginal discharge w/ itching/odor/irritation, headaches, visual changes, shortness of breath, chest pain, abdominal pain, severe nausea/vomiting, or problems with urination or bowel movements unless otherwise stated above. Pertinent History Reviewed:  Reviewed past medical,surgical, social, obstetrical and family history.  Reviewed problem list, medications and allergies. Physical Assessment:   Vitals:   06/24/20 0910  BP: 135/73  Pulse: (!) 111  Weight: 206 lb 6.4 oz (93.6 kg)  Body mass index is 35.43 kg/m.        Physical Examination:   General appearance: Well appearing, and in no distress  Mental status: Alert, oriented to person, place, and time  Skin: Warm & dry  Cardiovascular: Normal heart rate noted  Respiratory: Normal respiratory effort, no distress  Abdomen: Soft, gravid, nontender  Pelvic: thin prep pap obtained         Extremities: Edema: None  Fetal Status: Fetal Heart Rate (bpm): 155   Movement: Absent    Chaperone: Peggy Dones   No  results found for this or any previous visit (from the past 24 hour(s)).  Assessment & Plan:  1) Low-risk pregnancy G1P0000 at [redacted]w[redacted]d with an Estimated Date of Delivery: 12/15/20    Meds: No orders of the defined types were placed in this encounter.  Labs/procedures today: pap, 1st IT wasn't ordered w/ NT last visit, so wants to do AFP today. Wants [facility]ama-Angel to draw today, declines Horizon, so will add sickle cell testing  Plan:  Continue routine obstetrical care  Next visit: prefers will be in person for u/s    Reviewed: Preterm labor symptoms and general obstetric precautions including but not limited to vaginal bleeding, contractions, leaking of fluid and fetal movement were reviewed in detail with the patient.  All questions were answered. Has home bp cuff. Check bp weekly, let us know if >140/90.   Follow-up: Return in about 3 weeks (around 07/15/2020) for LROB, ME:QASTMHD, in person, CNM.  No future appointments.  Orders Placed This Encounter  Procedures  . US OB Comp + [phone]   Cheral Marker CNM, [facility] 06/24/2020 9:36 AM

## 2020-06-25 LAB — SICKLE CELL SCREEN: Sickle Cell Screen: NEGATIVE

## 2020-06-25 LAB — AFP, SERUM, OPEN SPINA BIFIDA

## 2020-06-26 LAB — CYTOLOGY - PAP: Diagnosis: NEGATIVE

## 2020-07-01 LAB — AFP, SERUM, OPEN SPINA BIFIDA
AFP MoM: 1.29
AFP Value: 35.1 ng/mL
OSBR Risk 1 IN: 9894
Test Results:: NEGATIVE
Weight: 206 [lb_av]

## 2020-07-17 ENCOUNTER — Ambulatory Visit (INDEPENDENT_AMBULATORY_CARE_PROVIDER_SITE_OTHER): Payer: No Typology Code available for payment source

## 2020-07-17 ENCOUNTER — Ambulatory Visit (INDEPENDENT_AMBULATORY_CARE_PROVIDER_SITE_OTHER): Payer: No Typology Code available for payment source | Admitting: Advanced Practice Midwife

## 2020-07-17 ENCOUNTER — Other Ambulatory Visit: Payer: No Typology Code available for payment source

## 2020-07-17 ENCOUNTER — Other Ambulatory Visit: Payer: Self-pay

## 2020-07-17 VITALS — BP 128/73 | HR 106 | Wt 205.0 lb

## 2020-07-17 DIAGNOSIS — Z3A18 18 weeks gestation of pregnancy: Secondary | ICD-10-CM

## 2020-07-17 DIAGNOSIS — Z3402 Encounter for supervision of normal first pregnancy, second trimester: Secondary | ICD-10-CM

## 2020-07-17 DIAGNOSIS — Z363 Encounter for antenatal screening for malformations: Secondary | ICD-10-CM

## 2020-07-17 MED ORDER — ONDANSETRON 4 MG PO TBDP: 4.0000 mg | ORAL_TABLET | Freq: Four times a day (QID) | ORAL | 2 refills | Status: DC | PRN

## 2020-07-17 NOTE — Patient Instructions (Signed)
Brittany Mayo, I greatly value your feedback.  If you receive a survey following your visit with [place] today, we appreciate you taking the time to fill it out.  Thanks, [name], CNM     [facility] HAS MOVED!!! It is now [facility] at [facility] ([address] [place], [place] [address]) Entrance located off of [address] Free 24/7 valet parking   Go to [email or web address] to register for FREE online childbirth classes    Second Trimester of Pregnancy The second trimester is from week 14 through week 27 (months 4 through 6). The second trimester is often a time when you feel your best. Your body has adjusted to being pregnant, and you begin to feel better physically. Usually, morning sickness has lessened or quit completely, you may have more energy, and you may have an increase in appetite. The second trimester is also a time when the fetus is growing rapidly. At the end of the sixth month, the fetus is about 9 inches long and weighs about 1 pounds. You will likely begin to feel the baby move (quickening) between 16 and 20 weeks of pregnancy. Body changes during your second trimester Your body continues to go through many changes during your second trimester. The changes vary from woman to woman.  Your weight will continue to increase. You will notice your lower abdomen bulging out.  You may begin to get stretch marks on your hips, abdomen, and breasts.  You may develop headaches that can be relieved by medicines. The medicines should be approved by your health care provider.  You may urinate more often because the fetus is pressing on your bladder.  You may develop or continue to have heartburn as a result of your pregnancy.  You may develop constipation because certain hormones are causing the muscles that push waste through your intestines to slow down.  You may develop hemorrhoids or swollen, bulging veins (varicose veins).  You may have back  pain. This is caused by: ? Weight gain. ? Pregnancy hormones that are relaxing the joints in your pelvis. ? A shift in weight and the muscles that support your balance.  Your breasts will continue to grow and they will continue to become tender.  Your gums may bleed and may be sensitive to brushing and flossing.  Dark spots or blotches (chloasma, mask of pregnancy) may develop on your face. This will likely fade after the baby is born.  A dark line from your belly button to the pubic area (linea nigra) may appear. This will likely fade after the baby is born.  You may have changes in your hair. These can include thickening of your hair, rapid growth, and changes in texture. Some women also have hair loss during or after pregnancy, or hair that feels dry or thin. Your hair will most likely return to normal after your baby is born.  What to expect at prenatal visits During a routine prenatal visit:  You will be weighed to make sure you and the fetus are growing normally.  Your blood pressure will be taken.  Your abdomen will be measured to track your baby's growth.  The fetal heartbeat will be listened to.  Any test results from the previous visit will be discussed.  Your health care provider may ask you:  How you are feeling.  If you are feeling the baby move.  If you have had any abnormal symptoms, such as leaking fluid, bleeding, severe headaches, or  abdominal cramping.  If you are using any tobacco products, including cigarettes, chewing tobacco, and electronic cigarettes.  If you have any questions.  Other tests that may be performed during your second trimester include:  Blood tests that check for: ? Low iron levels (anemia). ? High blood sugar that affects pregnant women (gestational diabetes) between 10 and 28 weeks. ? Rh antibodies. This is to check for a protein on red blood cells (Rh factor).  Urine tests to check for infections, diabetes, or protein in the  urine.  An ultrasound to confirm the proper growth and development of the baby.  An amniocentesis to check for possible genetic problems.  Fetal screens for spina bifida and Down syndrome.  HIV (human immunodeficiency virus) testing. Routine prenatal testing includes screening for HIV, unless you choose not to have this test.  Follow these instructions at home: Medicines  Follow your health care provider's instructions regarding medicine use. Specific medicines may be either safe or unsafe to take during pregnancy.  Take a prenatal vitamin that contains at least 600 micrograms (mcg) of folic acid.  If you develop constipation, try taking a stool softener if your health care provider approves. Eating and drinking  Eat a balanced diet that includes fresh fruits and vegetables, whole grains, good sources of protein such as meat, eggs, or tofu, and low-fat dairy. Your health care provider will help you determine the amount of weight gain that is right for you.  Avoid raw meat and uncooked cheese. These carry germs that can cause birth defects in the baby.  If you have low calcium intake from food, talk to your health care provider about whether you should take a daily calcium supplement.  Limit foods that are high in fat and processed sugars, such as fried and sweet foods.  To prevent constipation: ? Drink enough fluid to keep your urine clear or pale yellow. ? Eat foods that are high in fiber, such as fresh fruits and vegetables, whole grains, and beans. Activity  Exercise only as directed by your health care provider. Most women can continue their usual exercise routine during pregnancy. Try to exercise for 30 minutes at least 5 days a week. Stop exercising if you experience uterine contractions.  Avoid heavy lifting, wear low heel shoes, and practice good posture.  A sexual relationship may be continued unless your health care provider directs you otherwise. Relieving pain and  discomfort  Wear a good support bra to prevent discomfort from breast tenderness.  Take warm sitz baths to soothe any pain or discomfort caused by hemorrhoids. Use hemorrhoid cream if your health care provider approves.  Rest with your legs elevated if you have leg cramps or low back pain.  If you develop varicose veins, wear support hose. Elevate your feet for 15 minutes, 3-4 times a day. Limit salt in your diet. Prenatal Care  Write down your questions. Take them to your prenatal visits.  Keep all your prenatal visits as told by your health care provider. This is important. Safety  Wear your seat belt at all times when driving.  Make a list of emergency phone numbers, including numbers for family, friends, the hospital, and police and fire departments. General instructions  Ask your health care provider for a referral to a local prenatal education class. Begin classes no later than the beginning of month 6 of your pregnancy.  Ask for help if you have counseling or nutritional needs during pregnancy. Your health care provider can offer advice or  refer you to specialists for help with various needs.  Do not use hot tubs, steam rooms, or saunas.  Do not douche or use tampons or scented sanitary pads.  Do not cross your legs for long periods of time.  Avoid cat litter boxes and soil used by cats. These carry germs that can cause birth defects in the baby and possibly loss of the fetus by miscarriage or stillbirth.  Avoid all smoking, herbs, alcohol, and unprescribed drugs. Chemicals in these products can affect the formation and growth of the baby.  Do not use any products that contain nicotine or tobacco, such as cigarettes and e-cigarettes. If you need help quitting, ask your health care provider.  Visit your dentist if you have not gone yet during your pregnancy. Use a soft toothbrush to brush your teeth and be gentle when you floss. Contact a health care provider if:  You  have dizziness.  You have mild pelvic cramps, pelvic pressure, or nagging pain in the abdominal area.  You have persistent nausea, vomiting, or diarrhea.  You have a bad smelling vaginal discharge.  You have pain when you urinate. Get help right away if:  You have a fever.  You are leaking fluid from your vagina.  You have spotting or bleeding from your vagina.  You have severe abdominal cramping or pain.  You have rapid weight gain or weight loss.  You have shortness of breath with chest pain.  You notice sudden or extreme swelling of your face, hands, ankles, feet, or legs.  You have not felt your baby move in over an hour.  You have severe headaches that do not go away when you take medicine.  You have vision changes. Summary  The second trimester is from week 14 through week 27 (months 4 through 6). It is also a time when the fetus is growing rapidly.  Your body goes through many changes during pregnancy. The changes vary from woman to woman.  Avoid all smoking, herbs, alcohol, and unprescribed drugs. These chemicals affect the formation and growth your baby.  Do not use any tobacco products, such as cigarettes, chewing tobacco, and e-cigarettes. If you need help quitting, ask your health care provider.  Contact your health care provider if you have any questions. Keep all prenatal visits as told by your health care provider. This is important. This information is not intended to replace advice given to you by your health care provider. Make sure you discuss any questions you have with your health care provider.

## 2020-07-17 NOTE — Progress Notes (Signed)
   LOW-RISK PREGNANCY VISIT Patient name: Brittany Mayo MRN [id number]  Date of birth: Jul 10, 1995 Chief Complaint:   Routine Prenatal Visit  History of Present Illness:   Brittany Mayo is a 25 y.o. G1P0000 female at [redacted]w[redacted]d with an Estimated Date of Delivery: 12/15/20 being seen today for ongoing management of a low-risk pregnancy.  Today she reports nausea. Contractions: Not present. Vag. Bleeding: None.  Movement: Absent. denies leaking of fluid. Review of Systems:   Pertinent items are noted in HPI Denies abnormal vaginal discharge w/ itching/odor/irritation, headaches, visual changes, shortness of breath, chest pain, abdominal pain, severe nausea/vomiting, or problems with urination or bowel movements unless otherwise stated above. Pertinent History Reviewed:  Reviewed past medical,surgical, social, obstetrical and family history.  Reviewed problem list, medications and allergies. Physical Assessment:   Vitals:   07/17/20 1139  BP: 128/73  Pulse: (!) 106  Weight: 205 lb (93 kg)  Body mass index is 35.19 kg/m.        Physical Examination:   General appearance: Well appearing, and in no distress  Mental status: Alert, oriented to person, place, and time  Skin: Warm & dry  Cardiovascular: Normal heart rate noted  Respiratory: Normal respiratory effort, no distress  Abdomen: Soft, gravid, nontender  Pelvic: Cervical exam deferred         Extremities: Edema: None  Fetal Status:     Movement: Absent   [place] 18+3 wks,cephalic,cx 3.4 cm,normal ovaries, anterior placenta gr 0,fhr 154 bpm,svp of fluid 3.8 cm,EFW 235 g 38%,anatomy complete,no obvious abnormalities   Chaperone: n/a    No results found for this or any previous visit (from the past 24 hour(s)).  Assessment & Plan:  1) Low-risk pregnancy G1P0000 at [redacted]w[redacted]d with an Estimated Date of Delivery: 12/15/20     Meds: No orders of the defined types were placed in this encounter.  Labs/procedures today: anatomy scan  Plan:   Continue routine obstetrical care ; Rx zofran  Next visit: prefers in person    Reviewed: Preterm labor symptoms and general obstetric precautions including but not limited to vaginal bleeding, contractions, leaking of fluid and fetal movement were reviewed in detail with the patient.  All questions were answered. Has home bp cuff. Check bp weekly, let us know if >140/90.   Follow-up: No follow-ups on file.  No orders of the defined types were placed in this encounter.  Jacklyn Shell DNP, CNM 07/17/2020 12:05 PM

## 2020-07-17 NOTE — Progress Notes (Signed)
[place] 18+3 wks,cephalic,cx 3.4 cm,normal ovaries, anterior placenta gr 0,fhr 154 bpm,svp of fluid 3.8 cm,EFW 235 g 38%,anatomy complete,no obvious abnormalities

## 2020-08-14 ENCOUNTER — Ambulatory Visit (INDEPENDENT_AMBULATORY_CARE_PROVIDER_SITE_OTHER): Payer: No Typology Code available for payment source | Admitting: Advanced Practice Midwife

## 2020-08-14 ENCOUNTER — Other Ambulatory Visit: Payer: Self-pay

## 2020-08-14 VITALS — BP 127/73 | HR 96 | Wt 206.0 lb

## 2020-08-14 DIAGNOSIS — Z3402 Encounter for supervision of normal first pregnancy, second trimester: Secondary | ICD-10-CM

## 2020-08-14 DIAGNOSIS — Z3A22 22 weeks gestation of pregnancy: Secondary | ICD-10-CM

## 2020-08-14 NOTE — Patient Instructions (Signed)

## 2020-08-14 NOTE — Progress Notes (Signed)
   LOW-RISK PREGNANCY VISIT Patient name: Brittany Mayo MRN [id number]  Date of birth: 1996/02/02 Chief Complaint:   Routine Prenatal Visit  History of Present Illness:   ZANIAH TITTERINGTON is a 25 y.o. G1P0000 female at [redacted]w[redacted]d with an Estimated Date of Delivery: 12/15/20 being seen today for ongoing management of a low-risk pregnancy.  Today she reports no complaints. Contractions: Not present. Vag. Bleeding: None.  Movement: Present. denies leaking of fluid.Nausea is much better.  Got Covid booster Saturday Review of Systems:   Pertinent items are noted in HPI Denies abnormal vaginal discharge w/ itching/odor/irritation, headaches, visual changes, shortness of breath, chest pain, abdominal pain, severe nausea/vomiting, or problems with urination or bowel movements unless otherwise stated above. Pertinent History Reviewed:  Reviewed past medical,surgical, social, obstetrical and family history.  Reviewed problem list, medications and allergies. Physical Assessment:   Vitals:   08/14/20 0929  BP: 127/73  Pulse: 96  Weight: 206 lb (93.4 kg)  Body mass index is 35.36 kg/m.        Physical Examination:   General appearance: Well appearing, and in no distress  Mental status: Alert, oriented to person, place, and time  Skin: Warm & dry  Cardiovascular: Normal heart rate noted  Respiratory: Normal respiratory effort, no distress  Abdomen: Soft, gravid, nontender  Pelvic: Cervical exam deferred         Extremities: Edema: None  Fetal Status: Fetal Heart Rate (bpm): 142   Movement: Present    Chaperone: n/a    No results found for this or any previous visit (from the past 24 hour(s)).  Assessment & Plan:  1) Low-risk pregnancy G1P0000 at [redacted]w[redacted]d with an Estimated Date of Delivery: 12/15/20    Meds: No orders of the defined types were placed in this encounter.  Labs/procedures today: none  Plan:  Continue routine obstetrical care  Next visit: prefers in person    Reviewed: Preterm labor  symptoms and general obstetric precautions including but not limited to vaginal bleeding, contractions, leaking of fluid and fetal movement were reviewed in detail with the patient.  All questions were answered. Has home bp cuff.. Check bp weekly, let us know if >140/90.   Follow-up: Return in about 4 weeks (around 09/11/2020) for PN2/LROB.  No orders of the defined types were placed in this encounter.  Jacklyn Shell DNP, CNM 08/14/2020 9:48 AM

## 2020-08-27 ENCOUNTER — Encounter: Payer: Self-pay | Admitting: Women's Health

## 2020-09-11 ENCOUNTER — Ambulatory Visit (INDEPENDENT_AMBULATORY_CARE_PROVIDER_SITE_OTHER): Payer: No Typology Code available for payment source | Admitting: Advanced Practice Midwife

## 2020-09-11 ENCOUNTER — Other Ambulatory Visit: Payer: Self-pay

## 2020-09-11 ENCOUNTER — Other Ambulatory Visit: Payer: No Typology Code available for payment source

## 2020-09-11 VITALS — BP 133/81 | HR 99 | Wt 210.0 lb

## 2020-09-11 DIAGNOSIS — Z3402 Encounter for supervision of normal first pregnancy, second trimester: Secondary | ICD-10-CM

## 2020-09-11 DIAGNOSIS — Z23 Encounter for immunization: Secondary | ICD-10-CM | POA: Diagnosis not present

## 2020-09-11 DIAGNOSIS — Z3A26 26 weeks gestation of pregnancy: Secondary | ICD-10-CM

## 2020-09-11 DIAGNOSIS — Z131 Encounter for screening for diabetes mellitus: Secondary | ICD-10-CM

## 2020-09-11 NOTE — Progress Notes (Signed)
   LOW-RISK PREGNANCY VISIT Patient name: Brittany Mayo MRN [id number]  Date of birth: 03/08/1996 Chief Complaint:   Routine Prenatal Visit  History of Present Illness:   Brittany Mayo is a 25 y.o. G1P0000 female at [redacted]w[redacted]d with an Estimated Date of Delivery: 12/15/20 being seen today for ongoing management of a low-risk pregnancy.  Today she reports no complaints. Contractions: Not present. Vag. Bleeding: None.  Movement: Present. denies leaking of fluid. Review of Systems:   Pertinent items are noted in HPI Denies abnormal vaginal discharge w/ itching/odor/irritation, headaches, visual changes, shortness of breath, chest pain, abdominal pain, severe nausea/vomiting, or problems with urination or bowel movements unless otherwise stated above. Pertinent History Reviewed:  Reviewed past medical,surgical, social, obstetrical and family history.  Reviewed problem list, medications and allergies. Physical Assessment:   Vitals:   09/11/20 0904  BP: 133/81  Pulse: 99  Weight: 210 lb (95.3 kg)  Body mass index is 36.05 kg/m.        Physical Examination:   General appearance: Well appearing, and in no distress  Mental status: Alert, oriented to person, place, and time  Skin: Warm & dry  Cardiovascular: Normal heart rate noted  Respiratory: Normal respiratory effort, no distress  Abdomen: Soft, gravid, nontender  Pelvic: Cervical exam deferred         Extremities: Edema: None  Fetal Status: Fetal Heart Rate (bpm): 140 Fundal Height: 26 cm Movement: Present    Chaperone: n/a    No results found for this or any previous visit (from the past 24 hour(s)).  Assessment & Plan:  1) Low-risk pregnancy G1P0000 at [redacted]w[redacted]d with an Estimated Date of Delivery: 12/15/20      Meds: No orders of the defined types were placed in this encounter.  Labs/procedures today: PN2  Plan:  Continue routine obstetrical care  Next visit: prefers in person    Reviewed: Preterm labor symptoms and general  obstetric precautions including but not limited to vaginal bleeding, contractions, leaking of fluid and fetal movement were reviewed in detail with the patient.  All questions were answered. Has home bp cuff. Check bp weekly, let us know if >140/90.   Follow-up: Return in about 3 weeks (around 10/02/2020) for LROB.  Orders Placed This Encounter  Procedures  . Tdap vaccine greater than or equal to 7yo IM   Jacklyn Shell DNP, CNM 09/11/2020 9:22 AM

## 2020-09-11 NOTE — Patient Instructions (Signed)
Brittany Mayo, I greatly value your feedback.  If you receive a survey following your visit with [place] today, we appreciate you taking the time to fill it out.  Thanks, Brittany Mayo, CNM   [facility] HAS MOVED!!! It is now [facility] at [facility] ([address] [place], [place] [address]) Entrance located off of [address] Free 24/7 valet parking   Go to [email or web address] to register for FREE online childbirth classes    Call the office [phone]) or go to [facility] if:  You begin to have strong, frequent contractions  Your water breaks.  Sometimes it is a big gush of fluid, sometimes it is just a trickle that keeps getting your panties wet or running down your legs  You have vaginal bleeding.  It is normal to have a small amount of spotting if your cervix was checked.   You don't feel your baby moving like normal.  If you don't, get you something to eat and drink and lay down and focus on feeling your baby move.  You should feel at least 10 movements in 2 hours.  If you don't, you should call the office or go to [facility].    Tdap Vaccine  It is recommended that you get the Tdap vaccine during the third trimester of EACH pregnancy to help protect your baby from getting pertussis (whooping cough)  27-36 weeks is the BEST time to do this so that you can pass the protection on to your baby. During pregnancy is better than after pregnancy, but if you are unable to get it during pregnancy it will be offered at the hospital.   You will be offered this vaccine in the office after 27 weeks. If you do not have health insurance, you can get this vaccine at the health department or your family doctor  Everyone who will be around your baby should also be up-to-date on their vaccines. Adults (who are not pregnant) only need 1 dose of Tdap during adulthood.   Third Trimester of Pregnancy The third trimester is from week 29 through week 42, months 7 through 9. The  third trimester is a time when the fetus is growing rapidly. At the end of the ninth month, the fetus is about 20 inches in length and weighs 6-10 pounds.  BODY CHANGES Your body goes through many changes during pregnancy. The changes vary from woman to woman.   Your weight will continue to increase. You can expect to gain 25-35 pounds (11-16 kg) by the end of the pregnancy.  You may begin to get stretch marks on your hips, abdomen, and breasts.  You may urinate more often because the fetus is moving lower into your pelvis and pressing on your bladder.  You may develop or continue to have heartburn as a result of your pregnancy.  You may develop constipation because certain hormones are causing the muscles that push waste through your intestines to slow down.  You may develop hemorrhoids or swollen, bulging veins (varicose veins).  You may have pelvic pain because of the weight gain and pregnancy hormones relaxing your joints between the bones in your pelvis. Backaches may result from overexertion of the muscles supporting your posture.  You may have changes in your hair. These can include thickening of your hair, rapid growth, and changes in texture. Some women also have hair loss during or after pregnancy, or hair that feels dry or thin. Your hair will most likely return to  normal after your baby is born.  Your breasts will continue to grow and be tender. A yellow discharge may leak from your breasts called colostrum.  Your belly button may stick out.  You may feel short of breath because of your expanding uterus.  You may notice the fetus "dropping," or moving lower in your abdomen.  You may have a bloody mucus discharge. This usually occurs a few days to a week before labor begins.  Your cervix becomes thin and soft (effaced) near your due date. WHAT TO EXPECT AT YOUR PRENATAL EXAMS  You will have prenatal exams every 2 weeks until week 36. Then, you will have weekly prenatal  exams. During a routine prenatal visit:  You will be weighed to make sure you and the fetus are growing normally.  Your blood pressure is taken.  Your abdomen will be measured to track your baby's growth.  The fetal heartbeat will be listened to.  Any test results from the previous visit will be discussed.  You may have a cervical check near your due date to see if you have effaced. At around 36 weeks, your caregiver will check your cervix. At the same time, your caregiver will also perform a test on the secretions of the vaginal tissue. This test is to determine if a type of bacteria, Group B streptococcus, is present. Your caregiver will explain this further. Your caregiver may ask you:  What your birth plan is.  How you are feeling.  If you are feeling the baby move.  If you have had any abnormal symptoms, such as leaking fluid, bleeding, severe headaches, or abdominal cramping.  If you have any questions. Other tests or screenings that may be performed during your third trimester include:  Blood tests that check for low iron levels (anemia).  Fetal testing to check the health, activity level, and growth of the fetus. Testing is done if you have certain medical conditions or if there are problems during the pregnancy. FALSE LABOR You may feel small, irregular contractions that eventually go away. These are called Braxton Hicks contractions, or false labor. Contractions may last for hours, days, or even weeks before true labor sets in. If contractions come at regular intervals, intensify, or become painful, it is best to be seen by your caregiver.  SIGNS OF LABOR   Menstrual-like cramps.  Contractions that are 5 minutes apart or less.  Contractions that start on the top of the uterus and spread down to the lower abdomen and back.  A sense of increased pelvic pressure or back pain.  A watery or bloody mucus discharge that comes from the vagina. If you have any of these  signs before the 37th week of pregnancy, call your caregiver right away. You need to go to the hospital to get checked immediately. HOME CARE INSTRUCTIONS   Avoid all smoking, herbs, alcohol, and unprescribed drugs. These chemicals affect the formation and growth of the baby.  Follow your caregiver's instructions regarding medicine use. There are medicines that are either safe or unsafe to take during pregnancy.  Exercise only as directed by your caregiver. Experiencing uterine cramps is a good sign to stop exercising.  Continue to eat regular, healthy meals.  Wear a good support bra for breast tenderness.  Do not use hot tubs, steam rooms, or saunas.  Wear your seat belt at all times when driving.  Avoid raw meat, uncooked cheese, cat litter boxes, and soil used by cats. These carry germs that can  cause birth defects in the baby.  Take your prenatal vitamins.  Try taking a stool softener (if your caregiver approves) if you develop constipation. Eat more high-fiber foods, such as fresh vegetables or fruit and whole grains. Drink plenty of fluids to keep your urine clear or pale yellow.  Take warm sitz baths to soothe any pain or discomfort caused by hemorrhoids. Use hemorrhoid cream if your caregiver approves.  If you develop varicose veins, wear support hose. Elevate your feet for 15 minutes, 3-4 times a day. Limit salt in your diet.  Avoid heavy lifting, wear low heal shoes, and practice good posture.  Rest a lot with your legs elevated if you have leg cramps or low back pain.  Visit your dentist if you have not gone during your pregnancy. Use a soft toothbrush to brush your teeth and be gentle when you floss.  A sexual relationship may be continued unless your caregiver directs you otherwise.  Do not travel far distances unless it is absolutely necessary and only with the approval of your caregiver.  Take prenatal classes to understand, practice, and ask questions about the  labor and delivery.  Make a trial run to the hospital.  Pack your hospital bag.  Prepare the baby's nursery.  Continue to go to all your prenatal visits as directed by your caregiver. SEEK MEDICAL CARE IF:  You are unsure if you are in labor or if your water has broken.  You have dizziness.  You have mild pelvic cramps, pelvic pressure, or nagging pain in your abdominal area.  You have persistent nausea, vomiting, or diarrhea.  You have a bad smelling vaginal discharge.  You have pain with urination. SEEK IMMEDIATE MEDICAL CARE IF:   You have a fever.  You are leaking fluid from your vagina.  You have spotting or bleeding from your vagina.  You have severe abdominal cramping or pain.  You have rapid weight loss or gain.  You have shortness of breath with chest pain.  You notice sudden or extreme swelling of your face, hands, ankles, feet, or legs.  You have not felt your baby move in over an hour.  You have severe headaches that do not go away with medicine.  You have vision changes. Document Released: 05/04/2001 Document Revised: 05/15/2013 Document Reviewed: 07/11/2012 [facility] Patient Information 2015 [place], [place]. This information is not intended to replace advice given to you by your health care provider. Make sure you discuss any questions you have with your health care provider.

## 2020-09-12 LAB — CBC
Hematocrit: 34.9 % (ref 34.0–46.6)
Hemoglobin: 11.6 g/dL (ref 11.1–15.9)
MCH: 27.9 pg (ref 26.6–33.0)
MCHC: 33.2 g/dL (ref 31.5–35.7)
MCV: 84 fL (ref 79–97)
Platelets: 207 10*3/uL (ref 150–450)
RBC: 4.16 x10E6/uL (ref 3.77–5.28)
RDW: 12.8 % (ref 11.7–15.4)
WBC: 7.7 10*3/uL (ref 3.4–10.8)

## 2020-09-12 LAB — RPR: RPR Ser Ql: NONREACTIVE

## 2020-09-12 LAB — GLUCOSE TOLERANCE, 2 HOURS W/ 1HR
Glucose, 1 hour: 138 mg/dL (ref 65–179)
Glucose, 2 hour: 67 mg/dL (ref 65–152)
Glucose, Fasting: 77 mg/dL (ref 65–91)

## 2020-09-12 LAB — ANTIBODY SCREEN: Antibody Screen: NEGATIVE

## 2020-09-12 LAB — HIV ANTIBODY (ROUTINE TESTING W REFLEX): HIV Screen 4th Generation wRfx: NONREACTIVE

## 2020-10-02 ENCOUNTER — Other Ambulatory Visit: Payer: Self-pay

## 2020-10-02 ENCOUNTER — Ambulatory Visit (INDEPENDENT_AMBULATORY_CARE_PROVIDER_SITE_OTHER): Payer: Medicaid Other | Admitting: Women's Health

## 2020-10-02 ENCOUNTER — Encounter: Payer: Self-pay | Admitting: Women's Health

## 2020-10-02 VITALS — BP 144/83 | HR 113 | Wt 212.0 lb

## 2020-10-02 DIAGNOSIS — Z3A29 29 weeks gestation of pregnancy: Secondary | ICD-10-CM

## 2020-10-02 DIAGNOSIS — Z3403 Encounter for supervision of normal first pregnancy, third trimester: Secondary | ICD-10-CM

## 2020-10-02 DIAGNOSIS — R030 Elevated blood-pressure reading, without diagnosis of hypertension: Secondary | ICD-10-CM

## 2020-10-02 LAB — POCT URINALYSIS DIPSTICK OB
Blood, UA: NEGATIVE
Glucose, UA: NEGATIVE
Ketones, UA: NEGATIVE
Leukocytes, UA: NEGATIVE
Nitrite, UA: NEGATIVE
POC,PROTEIN,UA: NEGATIVE

## 2020-10-02 NOTE — Progress Notes (Signed)
   LOW-RISK PREGNANCY VISIT Patient name: Brittany Mayo MRN [id number]  Date of birth: 1996-02-27 Chief Complaint:   Routine Prenatal Visit  History of Present Illness:   Brittany Mayo is a 25 y.o. G1P0000 female at [redacted]w[redacted]d with an Estimated Date of Delivery: 12/15/20 being seen today for ongoing management of a low-risk pregnancy.  Depression screen [facility] 2/9 09/11/2020 06/03/2020 02/21/2020  Decreased Interest 0 1 0  Down, Depressed, Hopeless 0 0 0  PHQ - 2 Score 0 1 0  Altered sleeping 0 0 1  Tired, decreased energy 0 1 1  Change in appetite 0 0 0  Feeling bad or failure about yourself  0 0 0  Trouble concentrating 0 0 0  Moving slowly or fidgety/restless 0 0 0  Suicidal thoughts 0 0 0  PHQ-9 Score 0 2 2  Difficult doing work/chores - - Not difficult at all    Today she reports no complaints. Denies ha, visual changes, ruq/epigastric pain, n/v. Home bp's normal, last checked on Sunday.   Contractions: Not present.  .  Movement: Present. denies leaking of fluid. Review of Systems:   Pertinent items are noted in HPI Denies abnormal vaginal discharge w/ itching/odor/irritation, headaches, visual changes, shortness of breath, chest pain, abdominal pain, severe nausea/vomiting, or problems with urination or bowel movements unless otherwise stated above. Pertinent History Reviewed:  Reviewed past medical,surgical, social, obstetrical and family history.  Reviewed problem list, medications and allergies. Physical Assessment:   Vitals:   10/02/20 0844  BP: (!) 144/83  Pulse: (!) 113  Weight: 212 lb (96.2 kg)  Body mass index is 36.39 kg/m.        Physical Examination:   General appearance: Well appearing, and in no distress  Mental status: Alert, oriented to person, place, and time  Skin: Warm & dry  Cardiovascular: Normal heart rate noted  Respiratory: Normal respiratory effort, no distress  Abdomen: Soft, gravid, nontender  Pelvic: Cervical exam deferred         Extremities:  Edema: None  Fetal Status: Fetal Heart Rate (bpm): 160 Fundal Height: 30 cm Movement: Present    Chaperone: N/A   No results found for this or any previous visit (from the past 24 hour(s)).  Assessment & Plan:  1) Low-risk pregnancy G1P0000 at [redacted]w[redacted]d with an Estimated Date of Delivery: 12/15/20   2) Elevated bp, asymptomatic, will have nurse dip urine. Return tomorrow for bp check w/ nurse. Reviewed pre-e s/s, reasons to seek care. Continue ASA   Meds: No orders of the defined types were placed in this encounter.  Labs/procedures today: none  Plan:  Continue routine obstetrical care  Next visit: prefers in person    Reviewed: Preterm labor symptoms and general obstetric precautions including but not limited to vaginal bleeding, contractions, leaking of fluid and fetal movement were reviewed in detail with the patient.  All questions were answered. Does have home bp cuff. Office bp cuff given: not applicable. Check bp daily, let us know if consistently >140 and/or >90.  Follow-up: Return in about 3 weeks (around 10/23/2020) for LROB, CNM, in person; tomorrow for bp check w/ nurse.  Future Appointments  Date Time Provider Department Center  10/03/2020 11:30 AM [facility] NURSE [facility]  10/23/2020  8:30 AM Cresenzo-Dishmon, Scarlette Calico, CNM [facility]    No orders of the defined types were placed in this encounter.  Cheral Marker CNM, [facility] 10/02/2020 9:22 AM

## 2020-10-02 NOTE — Patient Instructions (Signed)
Brittany Mayo, I greatly value your feedback.  If you receive a survey following your visit with [place] today, we appreciate you taking the time to fill it out.  Thanks, [name], CNM, WHNP-BC   [facility] at [facility] ([address] [place], [place] [address]) Entrance C, located off of [address] valet parking  Go to [email or web address] to register for FREE online childbirth classes   Call the office [phone]) or go to [facility] if:  You begin to have strong, frequent contractions  Your water breaks.  Sometimes it is a big gush of fluid, sometimes it is just a trickle that keeps getting your panties wet or running down your legs  You have vaginal bleeding.  It is normal to have a small amount of spotting if your cervix was checked.   You don't feel your baby moving like normal.  If you don't, get you something to eat and drink and lay down and focus on feeling your baby move.  You should feel at least 10 movements in 2 hours.  If you don't, you should call the office or go to [facility].    Tdap Vaccine  It is recommended that you get the Tdap vaccine during the third trimester of EACH pregnancy to help protect your baby from getting pertussis (whooping cough)  27-36 weeks is the BEST time to do this so that you can pass the protection on to your baby. During pregnancy is better than after pregnancy, but if you are unable to get it during pregnancy it will be offered at the hospital.   You can get this vaccine with [place], at the health department, your family doctor, or some local pharmacies  Everyone who will be around your baby should also be up-to-date on their vaccines before the baby comes. Adults (who are not pregnant) only need 1 dose of Tdap during adulthood.    Pediatricians/Family Doctors:  [facility] [phone]            [facility] [phone]                 [facility]  [phone] (usually not accepting new patients unless you have family there already, you are always welcome to call and ask)       [facility] Department [phone]       [facility] Pediatricians/Family Doctors:   [facility]: [phone]  [facility]: [phone]  [facility]: [phone]  [facility] Doctors:   [facility]: [phone]   [facility]: [phone]  [facility] Doctors:  [facility]: [phone]   Home Blood Pressure Monitoring for Patients   Your provider has recommended that you check your blood pressure (BP) at least once a week at home. If you do not have a blood pressure cuff at home, one will be provided for you. Contact your provider if you have not received your monitor within 1 week.   Helpful Tips for Accurate Home Blood Pressure Checks  . Don't smoke, exercise, or drink caffeine 30 minutes before checking your BP . Use the restroom before checking your BP (a full bladder can raise your pressure) . Relax in a comfortable upright chair . Feet on the ground . Left arm resting comfortably on a flat surface at the level of your heart . Legs uncrossed . Back supported . Sit quietly and don't talk . Place the cuff on your  bare arm . Adjust snuggly, so that only two fingertips can fit between your skin and the top of the cuff . Check 2 readings separated by at least one minute . Keep a log of your BP readings . For a visual, please reference this diagram: http://ccnc.care/bpdiagram  Provider Name: [facility]     Phone: [phone]  Zone 1: ALL CLEAR  Continue to monitor your symptoms:  . BP reading is less than 140 (top number) or less than 90 (bottom number)  . No right upper stomach pain . No headaches or seeing spots . No feeling nauseated or throwing up . No swelling in face and hands  Zone 2: CAUTION Call your  doctor's office for any of the following:  . BP reading is greater than 140 (top number) or greater than 90 (bottom number)  . Stomach pain under your ribs in the middle or right side . Headaches or seeing spots . Feeling nauseated or throwing up . Swelling in face and hands  Zone 3: EMERGENCY  Seek immediate medical care if you have any of the following:  . BP reading is greater than160 (top number) or greater than 110 (bottom number) . Severe headaches not improving with Tylenol . Serious difficulty catching your breath . Any worsening symptoms from Zone 2   Third Trimester of Pregnancy The third trimester is from week 29 through week 42, months 7 through 9. The third trimester is a time when the fetus is growing rapidly. At the end of the ninth month, the fetus is about 20 inches in length and weighs 6-10 pounds.  BODY CHANGES Your body goes through many changes during pregnancy. The changes vary from woman to woman.   Your weight will continue to increase. You can expect to gain 25-35 pounds (11-16 kg) by the end of the pregnancy.  You may begin to get stretch marks on your hips, abdomen, and breasts.  You may urinate more often because the fetus is moving lower into your pelvis and pressing on your bladder.  You may develop or continue to have heartburn as a result of your pregnancy.  You may develop constipation because certain hormones are causing the muscles that push waste through your intestines to slow down.  You may develop hemorrhoids or swollen, bulging veins (varicose veins).  You may have pelvic pain because of the weight gain and pregnancy hormones relaxing your joints between the bones in your pelvis. Backaches may result from overexertion of the muscles supporting your posture.  You may have changes in your hair. These can include thickening of your hair, rapid growth, and changes in texture. Some women also have hair loss during or after pregnancy, or hair that  feels dry or thin. Your hair will most likely return to normal after your baby is born.  Your breasts will continue to grow and be tender. A yellow discharge may leak from your breasts called colostrum.  Your belly button may stick out.  You may feel short of breath because of your expanding uterus.  You may notice the fetus "dropping," or moving lower in your abdomen.  You may have a bloody mucus discharge. This usually occurs a few days to a week before labor begins.  Your cervix becomes thin and soft (effaced) near your due date. WHAT TO EXPECT AT YOUR PRENATAL EXAMS  You will have prenatal exams every 2 weeks until week 36. Then, you will have weekly prenatal exams. During a routine prenatal visit:  You will be weighed to make sure you and the fetus are growing normally.  Your blood pressure is taken.  Your abdomen will be measured to track your baby's growth.  The fetal heartbeat will be listened to.  Any test results from the previous visit will be discussed.  You may have a cervical check near your due date to see if you have effaced. At around 36 weeks, your caregiver will check your cervix. At the same time, your caregiver will also perform a test on the secretions of the vaginal tissue. This test is to determine if a type of bacteria, Group B streptococcus, is present. Your caregiver will explain this further. Your caregiver may ask you:  What your birth plan is.  How you are feeling.  If you are feeling the baby move.  If you have had any abnormal symptoms, such as leaking fluid, bleeding, severe headaches, or abdominal cramping.  If you have any questions. Other tests or screenings that may be performed during your third trimester include:  Blood tests that check for low iron levels (anemia).  Fetal testing to check the health, activity level, and growth of the fetus. Testing is done if you have certain medical conditions or if there are problems during the  pregnancy. FALSE LABOR You may feel small, irregular contractions that eventually go away. These are called Braxton Hicks contractions, or false labor. Contractions may last for hours, days, or even weeks before true labor sets in. If contractions come at regular intervals, intensify, or become painful, it is best to be seen by your caregiver.  SIGNS OF LABOR   Menstrual-like cramps.  Contractions that are 5 minutes apart or less.  Contractions that start on the top of the uterus and spread down to the lower abdomen and back.  A sense of increased pelvic pressure or back pain.  A watery or bloody mucus discharge that comes from the vagina. If you have any of these signs before the 37th week of pregnancy, call your caregiver right away. You need to go to the hospital to get checked immediately. HOME CARE INSTRUCTIONS   Avoid all smoking, herbs, alcohol, and unprescribed drugs. These chemicals affect the formation and growth of the baby.  Follow your caregiver's instructions regarding medicine use. There are medicines that are either safe or unsafe to take during pregnancy.  Exercise only as directed by your caregiver. Experiencing uterine cramps is a good sign to stop exercising.  Continue to eat regular, healthy meals.  Wear a good support bra for breast tenderness.  Do not use hot tubs, steam rooms, or saunas.  Wear your seat belt at all times when driving.  Avoid raw meat, uncooked cheese, cat litter boxes, and soil used by cats. These carry germs that can cause birth defects in the baby.  Take your prenatal vitamins.  Try taking a stool softener (if your caregiver approves) if you develop constipation. Eat more high-fiber foods, such as fresh vegetables or fruit and whole grains. Drink plenty of fluids to keep your urine clear or pale yellow.  Take warm sitz baths to soothe any pain or discomfort caused by hemorrhoids. Use hemorrhoid cream if your caregiver approves.  If you  develop varicose veins, wear support hose. Elevate your feet for 15 minutes, 3-4 times a day. Limit salt in your diet.  Avoid heavy lifting, wear low heal shoes, and practice good posture.  Rest a lot with your legs elevated if you have leg cramps or low  back pain.  Visit your dentist if you have not gone during your pregnancy. Use a soft toothbrush to brush your teeth and be gentle when you floss.  A sexual relationship may be continued unless your caregiver directs you otherwise.  Do not travel far distances unless it is absolutely necessary and only with the approval of your caregiver.  Take prenatal classes to understand, practice, and ask questions about the labor and delivery.  Make a trial run to the hospital.  Pack your hospital bag.  Prepare the baby's nursery.  Continue to go to all your prenatal visits as directed by your caregiver. SEEK MEDICAL CARE IF:  You are unsure if you are in labor or if your water has broken.  You have dizziness.  You have mild pelvic cramps, pelvic pressure, or nagging pain in your abdominal area.  You have persistent nausea, vomiting, or diarrhea.  You have a bad smelling vaginal discharge.  You have pain with urination. SEEK IMMEDIATE MEDICAL CARE IF:   You have a fever.  You are leaking fluid from your vagina.  You have spotting or bleeding from your vagina.  You have severe abdominal cramping or pain.  You have rapid weight loss or gain.  You have shortness of breath with chest pain.  You notice sudden or extreme swelling of your face, hands, ankles, feet, or legs.  You have not felt your baby move in over an hour.  You have severe headaches that do not go away with medicine.  You have vision changes. Document Released: 05/04/2001 Document Revised: 05/15/2013 Document Reviewed: 07/11/2012 [facility] Patient Information 2015 [place], [place]. This information is not intended to replace advice given to you by your health  care provider. Make sure you discuss any questions you have with your health care provider.

## 2020-10-02 NOTE — Addendum Note (Signed)
Addended by: Annamarie Dawley on: 10/02/2020 09:28 AM   Modules accepted: Orders

## 2020-10-03 ENCOUNTER — Ambulatory Visit (INDEPENDENT_AMBULATORY_CARE_PROVIDER_SITE_OTHER): Payer: Medicaid Other | Admitting: *Deleted

## 2020-10-03 VITALS — BP 135/83 | HR 110

## 2020-10-03 DIAGNOSIS — R030 Elevated blood-pressure reading, without diagnosis of hypertension: Secondary | ICD-10-CM

## 2020-10-03 NOTE — Progress Notes (Addendum)
   NURSE VISIT- BLOOD PRESSURE CHECK  SUBJECTIVE:  Brittany Mayo is a 25 y.o. G29P0000 female here for BP check. She is [redacted]w[redacted]d pregnant    HYPERTENSION ROS:  Pregnant/postpartum:  . Severe headaches that don't go away with tylenol/other medicines: No  . Visual changes (seeing spots/double/blurred vision) No  . Severe pain under right breast breast or in center of upper chest No  . Severe nausea/vomiting No  . Taking medicines as instructed yes  .   OBJECTIVE:  BP 135/83   Pulse (!) 110   LMP 03/02/2020 (Exact Date)   Appearance alert, well appearing, and in no distress.  ASSESSMENT: Pregnancy [redacted]w[redacted]d  blood pressure check  PLAN: Discussed with [name], CNM, [facility]   Recommendations: no changes needed   Follow-up: as scheduled pt will monitor bp at home and let us know if she gets elevated readings.    Annamarie Dawley  10/03/2020 11:33 AM   Chart reviewed for nurse visit. Agree with plan of care.  Cheral Marker, [place] 10/03/2020 12:09 PM

## 2020-10-23 ENCOUNTER — Other Ambulatory Visit: Payer: Self-pay

## 2020-10-23 ENCOUNTER — Ambulatory Visit (INDEPENDENT_AMBULATORY_CARE_PROVIDER_SITE_OTHER): Payer: Medicaid Other | Admitting: Advanced Practice Midwife

## 2020-10-23 VITALS — BP 132/80 | HR 100 | Wt 205.0 lb

## 2020-10-23 DIAGNOSIS — Z3A32 32 weeks gestation of pregnancy: Secondary | ICD-10-CM

## 2020-10-23 DIAGNOSIS — Z3403 Encounter for supervision of normal first pregnancy, third trimester: Secondary | ICD-10-CM

## 2020-10-23 NOTE — Patient Instructions (Signed)
Brittany Mayo, I greatly value your feedback.  If you receive a survey following your visit with [place] today, we appreciate you taking the time to fill it out.  Thanks, [name], DNP, CNM  [facility] HAS MOVED!!! It is now [facility] at [facility] ([address] [place], [place] [address]) Entrance located off of [address] Free 24/7 valet parking   Go to [email or web address] to register for FREE online childbirth classes    Call the office [phone]) or go to [facility] if:  You begin to have strong, frequent contractions  Your water breaks.  Sometimes it is a big gush of fluid, sometimes it is just a trickle that keeps getting your panties wet or running down your legs  You have vaginal bleeding.  It is normal to have a small amount of spotting if your cervix was checked.   You don't feel your baby moving like normal.  If you don't, get you something to eat and drink and lay down and focus on feeling your baby move.  You should feel at least 10 movements in 2 hours.  If you don't, you should call the office or go to [facility].   Home Blood Pressure Monitoring for Patients   Your provider has recommended that you check your blood pressure (BP) at least once a week at home. If you do not have a blood pressure cuff at home, one will be provided for you. Contact your provider if you have not received your monitor within 1 week.   Helpful Tips for Accurate Home Blood Pressure Checks  . Don't smoke, exercise, or drink caffeine 30 minutes before checking your BP . Use the restroom before checking your BP (a full bladder can raise your pressure) . Relax in a comfortable upright chair . Feet on the ground . Left arm resting comfortably on a flat surface at the level of your heart . Legs uncrossed . Back supported . Sit quietly and don't talk . Place the cuff on your bare arm . Adjust snuggly, so that only two fingertips can fit  between your skin and the top of the cuff . Check 2 readings separated by at least one minute . Keep a log of your BP readings . For a visual, please reference this diagram: http://ccnc.care/bpdiagram  Provider Name: [facility]     Phone: [phone]  Zone 1: ALL CLEAR  Continue to monitor your symptoms:  . BP reading is less than 140 (top number) or less than 90 (bottom number)  . No right upper stomach pain . No headaches or seeing spots . No feeling nauseated or throwing up . No swelling in face and hands  Zone 2: CAUTION Call your doctor's office for any of the following:  . BP reading is greater than 140 (top number) or greater than 90 (bottom number)  . Stomach pain under your ribs in the middle or right side . Headaches or seeing spots . Feeling nauseated or throwing up . Swelling in face and hands  Zone 3: EMERGENCY  Seek immediate medical care if you have any of the following:  . BP reading is greater than160 (top number) or greater than 110 (bottom number) . Severe headaches not improving with Tylenol . Serious difficulty catching your breath . Any worsening symptoms from Zone 2

## 2020-10-23 NOTE — Progress Notes (Signed)
   LOW-RISK PREGNANCY VISIT Patient name: Brittany Mayo MRN [id number]  Date of birth: [birth year] Chief Complaint:   Routine Prenatal Visit  History of Present Illness:   Brittany Mayo is a 25 y.o. G1P0000 female at [redacted]w[redacted]d with an Estimated Date of Delivery: 12/15/20 being seen today for ongoing management of a low-risk pregnancy.  Today she reports no complaints. Contractions: Not present. Vag. Bleeding: None.  Movement: Present. denies leaking of fluid. Eats "all the time"  Not sure why has lost weight. Bps at hor 120-130s/70s. Review of Systems:   Pertinent items are noted in HPI Denies abnormal vaginal discharge w/ itching/odor/irritation, headaches, visual changes, shortness of breath, chest pain, abdominal pain, severe nausea/vomiting, or problems with urination or bowel movements unless otherwise stated above. Pertinent History Reviewed:  Reviewed past medical,surgical, social, obstetrical and family history.  Reviewed problem list, medications and allergies. Physical Assessment:   Vitals:   10/23/20 0836  BP: 132/80  Pulse: 100  Weight: 205 lb (93 kg)  Body mass index is 35.19 kg/m.        Physical Examination:   General appearance: Well appearing, and in no distress  Mental status: Alert, oriented to person, place, and time  Skin: Warm & dry  Cardiovascular: Normal heart rate noted  Respiratory: Normal respiratory effort, no distress  Abdomen: Soft, gravid, nontender  Pelvic: Cervical exam deferred         Extremities: Edema: None  Fetal Status: Fetal Heart Rate (bpm): 147 Fundal Height: 33 cm Movement: Present    Chaperone: n/a    No results found for this or any previous visit (from the past 24 hour(s)).  Assessment & Plan:  1) Low-risk pregnancy G1P0000 at [redacted]w[redacted]d with an Estimated Date of Delivery: 12/15/20      Meds: No orders of the defined types were placed in this encounter.  Labs/procedures today: none  Plan:  Continue routine obstetrical care  Next  visit: prefers in person    Reviewed: Preterm labor symptoms and general obstetric precautions including but not limited to vaginal bleeding, contractions, leaking of fluid and fetal movement were reviewed in detail with the patient.  All questions were answered. Has home bp cuff. Check bp weekly, let us know if >140/90.   Follow-up: Return in about 2 weeks (around 11/06/2020) for LROB.  No orders of the defined types were placed in this encounter.  Jacklyn Shell DNP, CNM 10/23/2020 8:59 AM

## 2020-11-06 ENCOUNTER — Ambulatory Visit (INDEPENDENT_AMBULATORY_CARE_PROVIDER_SITE_OTHER): Payer: Medicaid Other | Admitting: Family Medicine

## 2020-11-06 ENCOUNTER — Encounter: Payer: Self-pay | Admitting: Family Medicine

## 2020-11-06 ENCOUNTER — Other Ambulatory Visit: Payer: Self-pay

## 2020-11-06 VITALS — BP 144/80 | HR 99 | Wt 208.2 lb

## 2020-11-06 DIAGNOSIS — Z3A34 34 weeks gestation of pregnancy: Secondary | ICD-10-CM

## 2020-11-06 DIAGNOSIS — Z3403 Encounter for supervision of normal first pregnancy, third trimester: Secondary | ICD-10-CM

## 2020-11-06 DIAGNOSIS — Z8759 Personal history of other complications of pregnancy, childbirth and the puerperium: Secondary | ICD-10-CM | POA: Insufficient documentation

## 2020-11-06 DIAGNOSIS — O133 Gestational [pregnancy-induced] hypertension without significant proteinuria, third trimester: Secondary | ICD-10-CM

## 2020-11-06 NOTE — Progress Notes (Signed)
   PRENATAL VISIT NOTE  Subjective:  Brittany Mayo is a 25 y.o. G1P0000 at [redacted]w[redacted]d being seen today for ongoing prenatal care.  She is currently monitored for the following issues for this high-risk pregnancy and has Supervision of normal first pregnancy and Gestational hypertension on their problem list.  Patient reports no complaints.  Contractions: Not present. Vag. Bleeding: None.  Movement: Present. Denies leaking of fluid.   The following portions of the patient's history were reviewed and updated as appropriate: allergies, current medications, past family history, past medical history, past social history, past surgical history and problem list.   Objective:   Vitals:   11/06/20 1132  BP: (!) 144/80  Pulse: 99  Weight: 208 lb 3.2 oz (94.4 kg)    Fetal Status:     Movement: Present     General:  Alert, oriented and cooperative. Patient is in no acute distress.  Skin: Skin is warm and dry. No rash noted.   Cardiovascular: Normal heart rate noted  Respiratory: Normal respiratory effort, no problems with respiration noted  Abdomen: Soft, gravid, appropriate for gestational age.  Pain/Pressure: Absent     Pelvic: Cervical exam deferred        Extremities: Normal range of motion.  Edema: None  Mental Status: Normal mood and affect. Normal behavior. Normal judgment and thought content.   Assessment and Plan:  Pregnancy: G1P0000 at [redacted]w[redacted]d 1. Encounter for supervision of normal first pregnancy in third trimester -doing well without complaints -taking PNV -discussed contraception, still unsure, referred to [email or web address] -gbs/gcc at next appt  2. Gestational hypertenstion Meets diagnostic criteria today, discussed diagnosis with patient and IOL 37-38 weeks. preE labs collected. Asymptomatic. Continue to monitor and given strict return precautions. Growth u/s and weekly BPP. Will not start bASA given gestational age.  Preterm labor symptoms and general obstetric precautions including  but not limited to vaginal bleeding, contractions, leaking of fluid and fetal movement were reviewed in detail with the patient. Please refer to After Visit Summary for other counseling recommendations.   Return in about 2 weeks (around 11/20/2020) for HROB.  No future appointments.  Alric Seton, MD

## 2020-11-07 LAB — CBC
Hematocrit: 36.2 % (ref 34.0–46.6)
Hemoglobin: 12.2 g/dL (ref 11.1–15.9)
MCH: 27.1 pg (ref 26.6–33.0)
MCHC: 33.7 g/dL (ref 31.5–35.7)
MCV: 80 fL (ref 79–97)
Platelets: 219 10*3/uL (ref 150–450)
RBC: 4.5 x10E6/uL (ref 3.77–5.28)
RDW: 13.1 % (ref 11.7–15.4)
WBC: 7.6 10*3/uL (ref 3.4–10.8)

## 2020-11-07 LAB — COMPREHENSIVE METABOLIC PANEL
ALT: 23 IU/L (ref 0–32)
AST: 26 IU/L (ref 0–40)
Albumin/Globulin Ratio: 1.6 (ref 1.2–2.2)
Albumin: 3.9 g/dL (ref 3.9–5.0)
Alkaline Phosphatase: 134 IU/L — ABNORMAL HIGH (ref 44–121)
BUN/Creatinine Ratio: 5 — ABNORMAL LOW (ref 9–23)
BUN: 4 mg/dL — ABNORMAL LOW (ref 6–20)
Bilirubin Total: <0.2 mg/dL (ref 0.0–1.2)
CO2: 17 mmol/L — ABNORMAL LOW (ref 20–29)
Calcium: 9.4 mg/dL (ref 8.7–10.2)
Chloride: 105 mmol/L (ref 96–106)
Creatinine, Ser: 0.86 mg/dL (ref 0.57–1.00)
Globulin, Total: 2.5 g/dL (ref 1.5–4.5)
Glucose: 109 mg/dL — ABNORMAL HIGH (ref 65–99)
Potassium: 4.1 mmol/L (ref 3.5–5.2)
Sodium: 137 mmol/L (ref 134–144)
Total Protein: 6.4 g/dL (ref 6.0–8.5)
eGFR: 97 mL/min/{1.73_m2} (ref >59)

## 2020-11-07 LAB — PROTEIN / CREATININE RATIO, URINE
Creatinine, Urine: 118.4 mg/dL
Protein, Ur: 12.9 mg/dL
Protein/Creat Ratio: 109 mg/g creat (ref 0–200)

## 2020-11-13 ENCOUNTER — Other Ambulatory Visit: Payer: Self-pay | Admitting: Women's Health

## 2020-11-13 DIAGNOSIS — O133 Gestational [pregnancy-induced] hypertension without significant proteinuria, third trimester: Secondary | ICD-10-CM

## 2020-11-14 ENCOUNTER — Inpatient Hospital Stay (HOSPITAL_BASED_OUTPATIENT_CLINIC_OR_DEPARTMENT_OTHER): Payer: Medicaid Other

## 2020-11-14 ENCOUNTER — Other Ambulatory Visit: Payer: Self-pay

## 2020-11-14 ENCOUNTER — Other Ambulatory Visit: Payer: Self-pay | Admitting: Women's Health

## 2020-11-14 ENCOUNTER — Encounter: Payer: Self-pay | Admitting: Women's Health

## 2020-11-14 ENCOUNTER — Ambulatory Visit (INDEPENDENT_AMBULATORY_CARE_PROVIDER_SITE_OTHER): Payer: Medicaid Other

## 2020-11-14 ENCOUNTER — Encounter (HOSPITAL_COMMUNITY): Payer: Self-pay | Admitting: Obstetrics & Gynecology

## 2020-11-14 ENCOUNTER — Inpatient Hospital Stay (HOSPITAL_COMMUNITY)
Admission: AD | Admit: 2020-11-14 | Discharge: 2020-11-14 | Disposition: A | Discharge status: Home / Self Care | Payer: Medicaid Other | Source: Ambulatory Visit | Attending: Obstetrics & Gynecology | Admitting: Obstetrics & Gynecology

## 2020-11-14 ENCOUNTER — Ambulatory Visit (INDEPENDENT_AMBULATORY_CARE_PROVIDER_SITE_OTHER): Payer: Medicaid Other | Admitting: Women's Health

## 2020-11-14 VITALS — BP 148/90 | HR 97 | Wt 212.0 lb

## 2020-11-14 DIAGNOSIS — O0993 Supervision of high risk pregnancy, unspecified, third trimester: Secondary | ICD-10-CM

## 2020-11-14 DIAGNOSIS — Z3A35 35 weeks gestation of pregnancy: Secondary | ICD-10-CM | POA: Diagnosis not present

## 2020-11-14 DIAGNOSIS — O289 Unspecified abnormal findings on antenatal screening of mother: Secondary | ICD-10-CM

## 2020-11-14 DIAGNOSIS — O133 Gestational [pregnancy-induced] hypertension without significant proteinuria, third trimester: Secondary | ICD-10-CM

## 2020-11-14 DIAGNOSIS — Z7982 Long term (current) use of aspirin: Secondary | ICD-10-CM | POA: Insufficient documentation

## 2020-11-14 DIAGNOSIS — O368330 Maternal care for abnormalities of the fetal heart rate or rhythm, third trimester, not applicable or unspecified: Secondary | ICD-10-CM | POA: Diagnosis not present

## 2020-11-14 DIAGNOSIS — Z3403 Encounter for supervision of normal first pregnancy, third trimester: Secondary | ICD-10-CM

## 2020-11-14 DIAGNOSIS — O368930 Maternal care for other specified fetal problems, third trimester, not applicable or unspecified: Secondary | ICD-10-CM | POA: Diagnosis present

## 2020-11-14 DIAGNOSIS — O0990 Supervision of high risk pregnancy, unspecified, unspecified trimester: Secondary | ICD-10-CM

## 2020-11-14 DIAGNOSIS — O368390 Maternal care for abnormalities of the fetal heart rate or rhythm, unspecified trimester, not applicable or unspecified: Secondary | ICD-10-CM

## 2020-11-14 LAB — POCT URINALYSIS DIPSTICK OB
Glucose, UA: NEGATIVE
Ketones, UA: NEGATIVE
Nitrite, UA: NEGATIVE
POC,PROTEIN,UA: NEGATIVE

## 2020-11-14 LAB — CBC
HCT: 34.7 % — ABNORMAL LOW (ref 36.0–46.0)
Hemoglobin: 11.4 g/dL — ABNORMAL LOW (ref 12.0–15.0)
MCH: 27 pg (ref 26.0–34.0)
MCHC: 32.9 g/dL (ref 30.0–36.0)
MCV: 82 fL (ref 80.0–100.0)
Platelets: 197 10*3/uL (ref 150–400)
RBC: 4.23 MIL/uL (ref 3.87–5.11)
RDW: 14.5 % (ref 11.5–15.5)
WBC: 6.4 10*3/uL (ref 4.0–10.5)
nRBC: 0 % (ref 0.0–0.2)

## 2020-11-14 LAB — COMPREHENSIVE METABOLIC PANEL
ALT: 29 U/L (ref 0–44)
AST: 33 U/L (ref 15–41)
Albumin: 2.7 g/dL — ABNORMAL LOW (ref 3.5–5.0)
Alkaline Phosphatase: 105 U/L (ref 38–126)
Anion gap: 6 (ref 5–15)
BUN: 5 mg/dL — ABNORMAL LOW (ref 6–20)
CO2: 21 mmol/L — ABNORMAL LOW (ref 22–32)
Calcium: 9 mg/dL (ref 8.9–10.3)
Chloride: 109 mmol/L (ref 98–111)
Creatinine, Ser: 0.79 mg/dL (ref 0.44–1.00)
GFR, Estimated: >60 mL/min (ref >60)
Glucose, Bld: 82 mg/dL (ref 70–99)
Potassium: 3.7 mmol/L (ref 3.5–5.1)
Sodium: 136 mmol/L (ref 135–145)
Total Bilirubin: <0.1 mg/dL — ABNORMAL LOW (ref 0.3–1.2)
Total Protein: 5.5 g/dL — ABNORMAL LOW (ref 6.5–8.1)

## 2020-11-14 LAB — PROTEIN / CREATININE RATIO, URINE
Creatinine, Urine: 104.22 mg/dL
Protein Creatinine Ratio: 0.15 mg/mg{Cre} (ref 0.00–0.15)
Total Protein, Urine: 16 mg/dL

## 2020-11-14 NOTE — Patient Instructions (Signed)
Brittany Mayo, thank you for choosing our office today! We appreciate the opportunity to meet your healthcare needs. You may receive a short survey by mail, e-mail, or through [employer]. If you are happy with your care we would appreciate if you could take just a few minutes to complete the survey questions. We read all of your comments and take your feedback very seriously. Thank you again for choosing our office.  Center for [employer] Team at [facility]  [facility] at [facility] ([address] [place], [place] [address]) Entrance C, located off of [address] Free 24/7 valet parking   CLASSES: Go to [email or web address] to register for classes (childbirth, breastfeeding, waterbirth, infant CPR, daddy bootcamp, etc.)  Call the office [phone]) or go to [facility] if: You begin to have strong, frequent contractions Your water breaks.  Sometimes it is a big gush of fluid, sometimes it is just a trickle that keeps getting your panties wet or running down your legs You have vaginal bleeding.  It is normal to have a small amount of spotting if your cervix was checked.  You don't feel your baby moving like normal.  If you don't, get you something to eat and drink and lay down and focus on feeling your baby move.   If your baby is still not moving like normal, you should call the office or go to [facility].  Call the office [phone]) or go to [facility] hospital for these signs of pre-eclampsia: Severe headache that does not go away with Tylenol Visual changes- seeing spots, double, blurred vision Pain under your right breast or upper abdomen that does not go away with Tums or heartburn medicine Nausea and/or vomiting Severe swelling in your hands, feet, and face   Tdap Vaccine It is recommended that you get the Tdap vaccine during the third trimester of EACH pregnancy to help protect your baby from getting pertussis (whooping cough) 27-36 weeks is the BEST time to do  this so that you can pass the protection on to your baby. During pregnancy is better than after pregnancy, but if you are unable to get it during pregnancy it will be offered at the hospital.  You can get this vaccine with [place], at the health department, your family doctor, or some local pharmacies Everyone who will be around your baby should also be up-to-date on their vaccines before the baby comes. Adults (who are not pregnant) only need 1 dose of Tdap during adulthood.   [facility] Pediatricians/Family Doctors [facility]): [address] Colette Ribas, [phone]           [facility]: [address], [phone]                [facility]): [address], [phone] (call to ask if accepting patients) [facility] Department: [address], [place], [phone]    [facility] Pediatricians/Family Doctors [facility]): [phone] [address], [phone] [facility]: [address], [phone] [facility]: [address], [phone]  [facility] Doctors  [facility]): [phone] [facility]: [address] [phone]   [facility] Doctors [facility]: [address], [phone]  [facility] Family Doctors  [facility]: [phone], [phone]  Home Blood Pressure Monitoring for Patients   Your provider has recommended that you check your  blood pressure (BP) at least once a week at home. If you do not have a blood pressure cuff at home, one will be provided for you. Contact your provider if you have not received your monitor within 1 week.   Helpful Tips for Accurate Home Blood Pressure Checks  Don't smoke, exercise, or drink caffeine 30 minutes before checking your BP Use the restroom before checking your BP (a full bladder can raise your  pressure) Relax in a comfortable upright chair Feet on the ground Left arm resting comfortably on a flat surface at the level of your heart Legs uncrossed Back supported Sit quietly and don't talk Place the cuff on your bare arm Adjust snuggly, so that only two fingertips can fit between your skin and the top of the cuff Check 2 readings separated by at least one minute Keep a log of your BP readings For a visual, please reference this diagram: http://ccnc.care/bpdiagram  Provider Name: [facility]     Phone: [phone]  Zone 1: ALL CLEAR  Continue to monitor your symptoms:  BP reading is less than 140 (top number) or less than 90 (bottom number)  No right upper stomach pain No headaches or seeing spots No feeling nauseated or throwing up No swelling in face and hands  Zone 2: CAUTION Call your doctor's office for any of the following:  BP reading is greater than 140 (top number) or greater than 90 (bottom number)  Stomach pain under your ribs in the middle or right side Headaches or seeing spots Feeling nauseated or throwing up Swelling in face and hands  Zone 3: EMERGENCY  Seek immediate medical care if you have any of the following:  BP reading is greater than160 (top number) or greater than 110 (bottom number) Severe headaches not improving with Tylenol Serious difficulty catching your breath Any worsening symptoms from Zone 2  Preterm Labor and Birth Information  The normal length of a pregnancy is 39-41 weeks. Preterm labor is when labor starts before 37 completed weeks of pregnancy. What are the risk factors for preterm labor? Preterm labor is more likely to occur in women who: Have certain infections during pregnancy such as a bladder infection, sexually transmitted infection, or infection inside the uterus (chorioamnionitis). Have a shorter-than-normal cervix. Have gone into preterm labor before. Have had surgery on their cervix. Are younger than age 17  or older than age 35. Are African American. Are pregnant with twins or multiple babies (multiple gestation). Take street drugs or smoke while pregnant. Do not gain enough weight while pregnant. Became pregnant shortly after having been pregnant. What are the symptoms of preterm labor? Symptoms of preterm labor include: Cramps similar to those that can happen during a menstrual period. The cramps may happen with diarrhea. Pain in the abdomen or lower back. Regular uterine contractions that may feel like tightening of the abdomen. A feeling of increased pressure in the pelvis. Increased watery or bloody mucus discharge from the vagina. Water breaking (ruptured amniotic sac). Why is it important to recognize signs of preterm labor? It is important to recognize signs of preterm labor because babies who are born prematurely may not be fully developed. This can put them at an increased risk for: Long-term (chronic) heart and lung problems. Difficulty immediately after birth with regulating body systems, including blood sugar, body temperature, heart rate, and breathing rate. Bleeding in the brain. Cerebral palsy. Learning difficulties. Death. These risks are highest for babies who are born before 34 weeks   of pregnancy. How is preterm labor treated? Treatment depends on the length of your pregnancy, your condition, and the health of your baby. It may involve: Having a stitch (suture) placed in your cervix to prevent your cervix from opening too early (cerclage). Taking or being given medicines, such as: Hormone medicines. These may be given early in pregnancy to help support the pregnancy. Medicine to stop contractions. Medicines to help mature the baby's lungs. These may be prescribed if the risk of delivery is high. Medicines to prevent your baby from developing cerebral palsy. If the labor happens before 34 weeks of pregnancy, you may need to stay in the hospital. What should I do if I  think I am in preterm labor? If you think that you are going into preterm labor, call your health care provider right away. How can I prevent preterm labor in future pregnancies? To increase your chance of having a full-term pregnancy: Do not use any tobacco products, such as cigarettes, chewing tobacco, and e-cigarettes. If you need help quitting, ask your health care provider. Do not use street drugs or medicines that have not been prescribed to you during your pregnancy. Talk with your health care provider before taking any herbal supplements, even if you have been taking them regularly. Make sure you gain a healthy amount of weight during your pregnancy. Watch for infection. If you think that you might have an infection, get it checked right away. Make sure to tell your health care provider if you have gone into preterm labor before. This information is not intended to replace advice given to you by your health care provider. Make sure you discuss any questions you have with your health care provider. Document Revised: 09/01/2018 Document Reviewed: 10/01/2015 Elsevier Patient Education  2020 Elsevier Inc.   

## 2020-11-14 NOTE — MAU Note (Signed)
Tracing reactive, BPP 6/8, sent in for further eval

## 2020-11-14 NOTE — MAU Provider Note (Signed)
Chief Complaint:  BPP 6/10 in clinic; sent by provider  HPI: Brittany Mayo is a 25 y.o. G1P0000 at [redacted]w[redacted]d by 6 week ultrasound who presents to maternity admissions given concern of BPP 6/10 in clinic today in the setting of gHTN and elevated umbilical artery dopplers (91%). Pt reports that she is doing well; no concerns prior to presenting to her clinic appointment today. She reports good fetal movement, denies LOF, vaginal bleeding, vaginal itching/burning, urinary symptoms, h/a, dizziness, n/v, or fever/chills.    Past Medical History: Past Medical History:  Diagnosis Date   Medical history non-contributory     Past obstetric history: OB History  Gravida Para Term Preterm AB Living  1 0 0 0 0 0  SAB IAB Ectopic Multiple Live Births  0 0 0 0 0    # Outcome Date GA Lbr Len/2nd Weight Sex Delivery Anes PTL Lv  1 Current             Past Surgical History: Past Surgical History:  Procedure Laterality Date   NO PAST SURGERIES      Family History: Family History  Problem Relation Age of Onset   Healthy Mother    Healthy Father    Healthy Sister    Healthy Sister    Healthy Sister     Social History: Social History   Tobacco Use   Smoking status: Never   Smokeless tobacco: Never  Vaping Use   Vaping Use: Never used  Substance Use Topics   Alcohol use: Not Currently   Drug use: No    Allergies:  Allergies  Allergen Reactions   Peanut-Containing Drug Products Hives and Rash    Meds:  Medications Prior to Admission  Medication Sig Dispense Refill Last Dose   aspirin 81 MG EC tablet Take 1 tablet (81 mg total) by mouth daily. Swallow whole. 90 tablet 3    ondansetron (ZOFRAN ODT) 4 MG disintegrating tablet Take 1 tablet (4 mg total) by mouth every 6 (six) hours as needed for nausea. 30 tablet 2    Prenatal Vit-Fe Fumarate-FA (PRENATAL VITAMIN PO) Take by mouth.       ROS:  Review of Systems  Constitutional:  Negative for chills and fever.  HENT:  Negative for  congestion and sore throat.   Eyes:  Negative for photophobia and visual disturbance.  Respiratory:  Negative for cough and shortness of breath.   Cardiovascular:  Negative for chest pain and leg swelling.  Gastrointestinal:  Negative for constipation, diarrhea, nausea and vomiting.  Genitourinary:  Negative for dysuria, vaginal bleeding, vaginal discharge and vaginal pain.  Neurological:  Negative for seizures, light-headedness and headaches.   I have reviewed patient's Past Medical Hx, Surgical Hx, Family Hx, Social Hx, medications and allergies.   Physical Exam  Patient Vitals for the past 24 hrs:  BP Pulse Resp  11/14/20 1951 133/82 90 18  11/14/20 1718 (!) 144/74 95 --  11/14/20 1716 (!) 144/74 98 18   Constitutional: Well-developed, well-nourished female in no acute distress.  Cardiovascular: normal rate Respiratory: normal effort GI: Abd soft, non-tender, gravid appropriate for gestational age.  MS: Extremities nontender, no edema, normal ROM Neurologic: Alert and oriented x 4.  GU: deferred   FHT:  Baseline 135, moderate variability, accelerations present, no decelerations Contractions: none   Labs: Results for orders placed or performed during the hospital encounter of 11/14/20 (from the past 24 hour(s))  Comprehensive metabolic panel     Status: Abnormal   Collection Time:  11/14/20  4:47 PM  Result Value Ref Range   Sodium 136 135 - 145 mmol/L   Potassium 3.7 3.5 - 5.1 mmol/L   Chloride 109 98 - 111 mmol/L   CO2 21 (L) 22 - 32 mmol/L   Glucose, Bld 82 70 - 99 mg/dL   BUN 5 (L) 6 - 20 mg/dL   Creatinine, Ser 8.84 0.44 - 1.00 mg/dL   Calcium 9.0 8.9 - 16.6 mg/dL   Total Protein 5.5 (L) 6.5 - 8.1 g/dL   Albumin 2.7 (L) 3.5 - 5.0 g/dL   AST 33 15 - 41 U/L   ALT 29 0 - 44 U/L   Alkaline Phosphatase 105 38 - 126 U/L   Total Bilirubin <0.1 (L) 0.3 - 1.2 mg/dL   GFR, Estimated >06 >30 mL/min   Anion gap 6 5 - 15  Protein / creatinine ratio, urine     Status: None    Collection Time: 11/14/20  4:47 PM  Result Value Ref Range   Creatinine, Urine 104.22 mg/dL   Total Protein, Urine 16 mg/dL   Protein Creatinine Ratio 0.15 0.00 - 0.15 mg/mg[Cre]  CBC     Status: Abnormal   Collection Time: 11/14/20  4:47 PM  Result Value Ref Range   WBC 6.4 4.0 - 10.5 K/uL   RBC 4.23 3.87 - 5.11 MIL/uL   Hemoglobin 11.4 (L) 12.0 - 15.0 g/dL   HCT 16.0 (L) 10.9 - 32.3 %   MCV 82.0 80.0 - 100.0 fL   MCH 27.0 26.0 - 34.0 pg   MCHC 32.9 30.0 - 36.0 g/dL   RDW 55.7 32.2 - 02.5 %   Platelets 197 150 - 400 K/uL   nRBC 0.0 0.0 - 0.2 %   O/Positive/-- (01/11 1059)  Imaging:  No results found.  MAU Course/MDM: Orders Placed This Encounter  Procedures   OB Urine Culture   [place] MFM FETAL BPP WO NON STRESS   Comprehensive metabolic panel   Protein / creatinine ratio, urine   CBC   Discharge patient Discharge disposition: 01-Home or Self Care; Discharge patient date: 11/14/2020   Discharge patient Discharge disposition: 01-Home or Self Care; Discharge patient date: 11/14/2020    No orders of the defined types were placed in this encounter.   NST reviewed and reactive. Discussed pt's presentation, exam findings and test results with Dr. Debroah Loop prior to discharge. Treatments in MAU included none. Pt discharge with strict return precautions for preterm labor, signs/symptoms of preeclampsia, vaginal bleeding, leakage of fluid, and decreased fetal movement.  Assessment & Plan: Brittany Mayo is a 25 y.o. G1P0000 at [redacted]w[redacted]d by 6 week ultrasound who presents to maternity admissions given concern of BPP 6/10 in clinic today in the setting of gHTN and elevated umbilical artery dopplers (91%). Reassuringly, repeat BPP in MAU 8/8 with reactive NST. Pt endorsing +FM. No red flag signs/symptoms. Blood pressure in mild range today. No signs/symptoms of severe preeclampsia. Will discharge home s/p conversation with Dr. Debroah Loop as noted above. 1. Supervision of high risk pregnancy, antepartum    2. Fetal heart rate non-reassuring affecting management of mother   3. Abnormal antenatal test   Discharge home with strict return precautions as noted above. Labor precautions and fetal kick counts Plan for close follow-up with prenatal provided as previously scheduled.  Allergies as of 11/14/2020       Reactions   Peanut-containing Drug Products Hives, Rash        Medication List     TAKE  these medications    aspirin 81 MG EC tablet Take 1 tablet (81 mg total) by mouth daily. Swallow whole.   ondansetron 4 MG disintegrating tablet Commonly known as: Zofran ODT Take 1 tablet (4 mg total) by mouth every 6 (six) hours as needed for nausea.   PRENATAL VITAMIN PO Take by mouth.       Sheila Oats, MD OB Fellow, Faculty Practice 11/14/2020 8:26 PM

## 2020-11-14 NOTE — Progress Notes (Signed)
HIGH-RISK PREGNANCY VISIT Patient name: Brittany Mayo MRN [id number]  Date of birth: 1995-11-21 Chief Complaint:   Routine Prenatal Visit (U/S today and NST)  History of Present Illness:   Brittany Mayo is a 25 y.o. G1P0000 female at [redacted]w[redacted]d with an Estimated Date of Delivery: 12/15/20 being seen today for ongoing management of a high-risk pregnancy complicated by gestational hypertension currently on no meds, dx @ 34wks.    Today she reports no complaints. Denies ha, visual changes, ruq/epigastric pain, n/v.   Contractions: Not present.  .  Movement: Present. denies leaking of fluid.   Depression screen [facility] 2/9 09/11/2020 06/03/2020 02/21/2020  Decreased Interest 0 1 0  Down, Depressed, Hopeless 0 0 0  PHQ - 2 Score 0 1 0  Altered sleeping 0 0 1  Tired, decreased energy 0 1 1  Change in appetite 0 0 0  Feeling bad or failure about yourself  0 0 0  Trouble concentrating 0 0 0  Moving slowly or fidgety/restless 0 0 0  Suicidal thoughts 0 0 0  PHQ-9 Score 0 2 2  Difficult doing work/chores - - Not difficult at all     GAD 7 : Generalized Anxiety Score 09/11/2020 06/03/2020 02/21/2020  Nervous, Anxious, on Edge 0 0 0  Control/stop worrying 0 0 0  Worry too much - different things 0 0 0  Trouble relaxing 0 0 0  Restless 0 0 0  Easily annoyed or irritable 0 0 1  Afraid - awful might happen 0 0 0  Total GAD 7 Score 0 0 1  Anxiety Difficulty - - Not difficult at all     Review of Systems:   Pertinent items are noted in HPI Denies abnormal vaginal discharge w/ itching/odor/irritation, headaches, visual changes, shortness of breath, chest pain, abdominal pain, severe nausea/vomiting, or problems with urination or bowel movements unless otherwise stated above. Pertinent History Reviewed:  Reviewed past medical,surgical, social, obstetrical and family history.  Reviewed problem list, medications and allergies. Physical Assessment:   Vitals:   11/14/20 1201  BP: (!) 148/90  Pulse: 97   Weight: 212 lb (96.2 kg)  Body mass index is 36.39 kg/m.           Physical Examination:   General appearance: alert, well appearing, and in no distress  Mental status: alert, oriented to person, place, and time  Skin: warm & dry   Extremities: Edema: Trace    Cardiovascular: normal heart rate noted  Respiratory: normal respiratory effort, no distress  Abdomen: gravid, soft, non-tender  Pelvic: Cervical exam deferred         Fetal Status:     Movement: Present    Fetal Surveillance Testing today:  [place] 35+4 wks,cephalic,anterior placenta gr 3,fhr 144 bpm,afi 13.7 cm,BPP 6/8 (0 for tone),RI .67,.72,.69,.66=91%,EFW 2916 g 71%,AC 94%,discussed results with [name]  NST: FHR baseline 135 bpm, Variability: moderate, Accelerations:present, Decelerations:  Absent= Cat 1/reactive Toco: none  Pt perceives normal FM while on EFM  Chaperone: N/A    Results for orders placed or performed in visit on 11/14/20 (from the past 24 hour(s))  POC Urinalysis Dipstick OB   Collection Time: 11/14/20 12:24 PM  Result Value Ref Range   Color, UA     Clarity, UA     Glucose, UA Negative Negative   Bilirubin, UA     Ketones, UA neg    Spec Grav, UA     Blood, UA mod non-hemolyzed    pH, UA  POC,PROTEIN,UA Negative Negative, Trace, Small (1+), Moderate (2+), Large (3+), 4+   Urobilinogen, UA     Nitrite, UA neg    Leukocytes, UA Moderate (2+) (A) Negative   Appearance     Odor      Assessment & Plan:  High-risk pregnancy: G1P0000 at [redacted]w[redacted]d with an Estimated Date of Delivery: 12/15/20   1) GHTN w/ elevated UAD 91%, good EDF, BPP 6/8 (0 for tone and very little movement per [facility]- just enough to count it), NST reactive- so total 8/10. No tone and very little movement on u/s concerning- discussed w/ Dr. Charlotta Newton, recommends go to MAU and repeat BPP at least 4hrs after this one. Notified MAU providers and Dr. Debroah Loop. BPs stable, asymptomatic. No proteinuria. Reviewed pre-e s/s, reasons to seek  care  Meds: No orders of the defined types were placed in this encounter.   Labs/procedures today: NST and U/S  Treatment Plan:  bpp/dopp Tues w/ HROB, NST/nurse Frid, IOL @ 37wks  Reviewed: Preterm labor symptoms and general obstetric precautions including but not limited to vaginal bleeding, contractions, leaking of fluid and fetal movement were reviewed in detail with the patient.  All questions were answered. Does have home bp cuff. Office bp cuff given: not applicable. Check bp daily, let us know if consistently >160 and/or >110.  Follow-up: Return for Tues BPP/DOPP, hrob MD/CNM, then Fri NST/nurse until 7/1.  Orders Placed This Encounter  Procedures   POC Urinalysis Dipstick OB   Cheral Marker CNM, [facility] 11/14/2020 1:26 PM

## 2020-11-14 NOTE — Progress Notes (Signed)
[place] 35+4 wks,cephalic,anterior placenta gr 3,fhr 144 bpm,afi 13.7 cm,BPP 6/8 (0 for tone),RI .67,.72,.69,.66=91%,EFW 2916 g 71%,AC 94%,discussed results with [name]

## 2020-11-16 LAB — CULTURE, OB URINE

## 2020-11-19 ENCOUNTER — Other Ambulatory Visit: Payer: Self-pay | Admitting: Women's Health

## 2020-11-19 DIAGNOSIS — O133 Gestational [pregnancy-induced] hypertension without significant proteinuria, third trimester: Secondary | ICD-10-CM

## 2020-11-21 ENCOUNTER — Other Ambulatory Visit (HOSPITAL_COMMUNITY)
Admission: RE | Admit: 2020-11-21 | Discharge: 2020-11-21 | Disposition: A | Discharge status: Home / Self Care | Payer: Medicaid Other | Source: Ambulatory Visit | Attending: Obstetrics and Gynecology | Admitting: Obstetrics and Gynecology

## 2020-11-21 ENCOUNTER — Ambulatory Visit (INDEPENDENT_AMBULATORY_CARE_PROVIDER_SITE_OTHER): Payer: Medicaid Other | Admitting: Obstetrics and Gynecology

## 2020-11-21 ENCOUNTER — Other Ambulatory Visit: Payer: Self-pay | Admitting: Obstetrics and Gynecology

## 2020-11-21 ENCOUNTER — Other Ambulatory Visit: Payer: Self-pay

## 2020-11-21 ENCOUNTER — Ambulatory Visit (INDEPENDENT_AMBULATORY_CARE_PROVIDER_SITE_OTHER): Payer: Medicaid Other

## 2020-11-21 VITALS — BP 145/83 | HR 100 | Wt 213.0 lb

## 2020-11-21 DIAGNOSIS — O133 Gestational [pregnancy-induced] hypertension without significant proteinuria, third trimester: Secondary | ICD-10-CM

## 2020-11-21 DIAGNOSIS — Z3A36 36 weeks gestation of pregnancy: Secondary | ICD-10-CM

## 2020-11-21 DIAGNOSIS — O0993 Supervision of high risk pregnancy, unspecified, third trimester: Secondary | ICD-10-CM

## 2020-11-21 DIAGNOSIS — Z1389 Encounter for screening for other disorder: Secondary | ICD-10-CM

## 2020-11-21 DIAGNOSIS — O0990 Supervision of high risk pregnancy, unspecified, unspecified trimester: Secondary | ICD-10-CM

## 2020-11-21 LAB — POCT URINALYSIS DIPSTICK OB
Blood, UA: NEGATIVE
Glucose, UA: NEGATIVE
Ketones, UA: NEGATIVE
Nitrite, UA: NEGATIVE
POC,PROTEIN,UA: NEGATIVE

## 2020-11-21 NOTE — Progress Notes (Signed)
[place] 34+4 wks,cephalic,BPP 8/8,anterior placenta gr 3,RI .66,.62,.60,.65,.63=76%,AFI 11.5 cm

## 2020-11-21 NOTE — Progress Notes (Signed)
   PRENATAL VISIT NOTE  Subjective:  Brittany Mayo is a 25 y.o. G1P0000 at [redacted]w[redacted]d being seen today for ongoing prenatal care.  She is currently monitored for the following issues for this high-risk pregnancy and has Supervision of high risk pregnancy, antepartum and Gestational hypertension on their problem list.  Patient reports no complaints.  Contractions: Not present. Vag. Bleeding: None.  Movement: Present. Denies leaking of fluid.   Doing well overall. Excited for baby.  Denies headaches or vision changes. No RUQ pain. No SOB.    The following portions of the patient's history were reviewed and updated as appropriate: allergies, current medications, past family history, past medical history, past social history, past surgical history and problem list.   Objective:   Vitals:   11/21/20 1243  BP: (!) 145/83  Pulse: 100  Weight: 213 lb (96.6 kg)    Fetal Status:     Movement: Present     General:  Alert, oriented and cooperative. Patient is in no acute distress.  Skin: Skin is warm and dry. No rash noted.   Cardiovascular: Normal heart rate noted  Respiratory: Normal respiratory effort, no problems with respiration noted  Abdomen: Soft, gravid, appropriate for gestational age.  Pain/Pressure: Absent     Pelvic: Cervical exam deferred        Extremities: Normal range of motion.  Edema: None  Mental Status: Normal mood and affect. Normal behavior. Normal judgment and thought content.   Assessment and Plan:  Pregnancy: G1P0000 at [redacted]w[redacted]d 1. [redacted] weeks gestation of pregnancy - Culture, beta strep (group b only) - Cervicovaginal ancillary only( [place])  2. Screening for genitourinary condition - POC Urinalysis Dipstick OB  3. High-risk pregnancy in third trimester - POC Urinalysis Dipstick OB  4. Gestational hypertension, third trimester -8/8 BPP today  -no signs/symptoms of Pre E w SF. Abnormal dopplers. Plan for IOL at 37 weeks, 7/4. LD and Staff notified.   Term  labor symptoms and general obstetric precautions including but not limited to vaginal bleeding, contractions, leaking of fluid and fetal movement were reviewed in detail with the patient. Please refer to After Visit Summary for other counseling recommendations.   No follow-ups on file.  Future Appointments  Date Time Provider Department Center  11/21/2020  1:15 PM Gita Kudo, MD [facility]  12/05/2020  9:45 AM [facility] [place] [facility] None  12/05/2020 10:30 AM Myna Hidalgo, DO [facility]  12/12/2020  9:30 AM [facility] [place] [facility] None  12/12/2020 10:30 AM Cheral Marker, CNM [facility]    Gita Kudo, MD

## 2020-11-24 ENCOUNTER — Encounter (HOSPITAL_COMMUNITY): Payer: Self-pay | Admitting: Obstetrics & Gynecology

## 2020-11-24 ENCOUNTER — Inpatient Hospital Stay (HOSPITAL_COMMUNITY): Payer: Medicaid Other | Admitting: Anesthesiology

## 2020-11-24 ENCOUNTER — Other Ambulatory Visit: Payer: Self-pay

## 2020-11-24 ENCOUNTER — Inpatient Hospital Stay (HOSPITAL_COMMUNITY)
Admission: AD | Admit: 2020-11-24 | Discharge: 2020-11-26 | DRG: 807 | Disposition: A | Discharge status: Home / Self Care | Payer: Medicaid Other | Attending: Obstetrics & Gynecology | Admitting: Obstetrics & Gynecology

## 2020-11-24 DIAGNOSIS — O139 Gestational [pregnancy-induced] hypertension without significant proteinuria, unspecified trimester: Secondary | ICD-10-CM | POA: Diagnosis present

## 2020-11-24 DIAGNOSIS — O134 Gestational [pregnancy-induced] hypertension without significant proteinuria, complicating childbirth: Principal | ICD-10-CM | POA: Diagnosis present

## 2020-11-24 DIAGNOSIS — Z3A37 37 weeks gestation of pregnancy: Secondary | ICD-10-CM

## 2020-11-24 DIAGNOSIS — O0990 Supervision of high risk pregnancy, unspecified, unspecified trimester: Secondary | ICD-10-CM

## 2020-11-24 DIAGNOSIS — O133 Gestational [pregnancy-induced] hypertension without significant proteinuria, third trimester: Secondary | ICD-10-CM

## 2020-11-24 DIAGNOSIS — Z20822 Contact with and (suspected) exposure to covid-19: Secondary | ICD-10-CM | POA: Diagnosis present

## 2020-11-24 DIAGNOSIS — Z8759 Personal history of other complications of pregnancy, childbirth and the puerperium: Secondary | ICD-10-CM | POA: Diagnosis present

## 2020-11-24 LAB — CBC
HCT: 36.5 % (ref 36.0–46.0)
Hemoglobin: 11.9 g/dL — ABNORMAL LOW (ref 12.0–15.0)
MCH: 26.9 pg (ref 26.0–34.0)
MCHC: 32.6 g/dL (ref 30.0–36.0)
MCV: 82.4 fL (ref 80.0–100.0)
Platelets: 204 10*3/uL (ref 150–400)
RBC: 4.43 MIL/uL (ref 3.87–5.11)
RDW: 14.7 % (ref 11.5–15.5)
WBC: 7.9 10*3/uL (ref 4.0–10.5)
nRBC: 0 % (ref 0.0–0.2)

## 2020-11-24 LAB — RESP PANEL BY RT-PCR (FLU A&B, COVID) ARPGX2
Influenza A by PCR: NEGATIVE
Influenza B by PCR: NEGATIVE
SARS Coronavirus 2 by RT PCR: NEGATIVE

## 2020-11-24 LAB — COMPREHENSIVE METABOLIC PANEL
ALT: 25 U/L (ref 0–44)
AST: 32 U/L (ref 15–41)
Albumin: 2.8 g/dL — ABNORMAL LOW (ref 3.5–5.0)
Alkaline Phosphatase: 131 U/L — ABNORMAL HIGH (ref 38–126)
Anion gap: 9 (ref 5–15)
BUN: 7 mg/dL (ref 6–20)
CO2: 19 mmol/L — ABNORMAL LOW (ref 22–32)
Calcium: 8.9 mg/dL (ref 8.9–10.3)
Chloride: 106 mmol/L (ref 98–111)
Creatinine, Ser: 0.91 mg/dL (ref 0.44–1.00)
GFR, Estimated: >60 mL/min (ref >60)
Glucose, Bld: 127 mg/dL — ABNORMAL HIGH (ref 70–99)
Potassium: 3.8 mmol/L (ref 3.5–5.1)
Sodium: 134 mmol/L — ABNORMAL LOW (ref 135–145)
Total Bilirubin: 0.4 mg/dL (ref 0.3–1.2)
Total Protein: 6.2 g/dL — ABNORMAL LOW (ref 6.5–8.1)

## 2020-11-24 LAB — OB RESULTS CONSOLE GBS: GBS: NEGATIVE

## 2020-11-24 LAB — PROTEIN / CREATININE RATIO, URINE
Creatinine, Urine: 44.34 mg/dL
Total Protein, Urine: <6 mg/dL

## 2020-11-24 LAB — TYPE AND SCREEN
ABO/RH(D): O POS
Antibody Screen: NEGATIVE

## 2020-11-24 MED ORDER — LIDOCAINE HCL (PF) 1 % IJ SOLN
INTRAMUSCULAR | Status: DC | PRN
  Administered 2020-11-24 (×2): 3 mL via EPIDURAL

## 2020-11-24 MED ORDER — DIPHENHYDRAMINE HCL 50 MG/ML IJ SOLN: 12.5000 mg | INTRAMUSCULAR | Status: DC | PRN

## 2020-11-24 MED ORDER — ZOLPIDEM TARTRATE 5 MG PO TABS: 5.0000 mg | ORAL_TABLET | Freq: Every evening | ORAL | Status: DC | PRN

## 2020-11-24 MED ORDER — TRANEXAMIC ACID-NACL 1000-0.7 MG/100ML-% IV SOLN
1000.0000 mg | INTRAVENOUS | Status: AC
  Administered 2020-11-24: 1000 mg via INTRAVENOUS

## 2020-11-24 MED ORDER — LIDOCAINE HCL (PF) 1 % IJ SOLN: 30.0000 mL | INTRAMUSCULAR | Status: DC | PRN

## 2020-11-24 MED ORDER — LACTATED RINGERS IV SOLN: INTRAVENOUS | Status: DC

## 2020-11-24 MED ORDER — EPHEDRINE 5 MG/ML INJ: 10.0000 mg | INTRAVENOUS | Status: DC | PRN

## 2020-11-24 MED ORDER — OXYTOCIN BOLUS FROM INFUSION
333.0000 mL | Freq: Once | INTRAVENOUS | Status: DC
  Administered 2020-11-24: 333 mL via INTRAVENOUS

## 2020-11-24 MED ORDER — TRANEXAMIC ACID-NACL 1000-0.7 MG/100ML-% IV SOLN
INTRAVENOUS | Status: AC
  Administered 2020-11-24: 1000 mg
  Filled 2020-11-24: qty 100

## 2020-11-24 MED ORDER — TERBUTALINE SULFATE 1 MG/ML IJ SOLN: 0.2500 mg | Freq: Once | INTRAMUSCULAR | Status: DC | PRN

## 2020-11-24 MED ORDER — OXYTOCIN-SODIUM CHLORIDE 30-0.9 UT/500ML-% IV SOLN: 2.5000 [IU]/h | INTRAVENOUS | Status: DC

## 2020-11-24 MED ORDER — FENTANYL-BUPIVACAINE-NACL 0.5-0.125-0.9 MG/250ML-% EP SOLN
12.0000 mL/h | EPIDURAL | Status: DC | PRN
Start: 2020-11-24 — End: 2020-11-25
  Administered 2020-11-24: 12 mL/h via EPIDURAL
  Filled 2020-11-24: qty 250

## 2020-11-24 MED ORDER — SOD CITRATE-CITRIC ACID 500-334 MG/5ML PO SOLN: 30.0000 mL | ORAL | Status: DC | PRN

## 2020-11-24 MED ORDER — LACTATED RINGERS IV SOLN: 500.0000 mL | INTRAVENOUS | Status: DC | PRN

## 2020-11-24 MED ORDER — PHENYLEPHRINE 40 MCG/ML (10ML) SYRINGE FOR IV PUSH (FOR BLOOD PRESSURE SUPPORT): 80.0000 ug | PREFILLED_SYRINGE | INTRAVENOUS | Status: DC | PRN

## 2020-11-24 MED ORDER — ACETAMINOPHEN 325 MG PO TABS: 650.0000 mg | ORAL_TABLET | ORAL | Status: DC | PRN

## 2020-11-24 MED ORDER — ONDANSETRON HCL 4 MG/2ML IJ SOLN
4.0000 mg | Freq: Four times a day (QID) | INTRAMUSCULAR | Status: DC | PRN
  Administered 2020-11-24: 4 mg via INTRAVENOUS
  Filled 2020-11-24: qty 2

## 2020-11-24 MED ORDER — TRANEXAMIC ACID-NACL 1000-0.7 MG/100ML-% IV SOLN: 1000.0000 mg | Freq: Once | INTRAVENOUS | Status: DC | PRN

## 2020-11-24 MED ORDER — LACTATED RINGERS IV SOLN
500.0000 mL | Freq: Once | INTRAVENOUS | Status: AC
  Administered 2020-11-24: 500 mL via INTRAVENOUS

## 2020-11-24 MED ORDER — FENTANYL CITRATE (PF) 100 MCG/2ML IJ SOLN: 50.0000 ug | INTRAMUSCULAR | Status: DC | PRN

## 2020-11-24 MED ORDER — OXYTOCIN-SODIUM CHLORIDE 30-0.9 UT/500ML-% IV SOLN
1.0000 m[IU]/min | INTRAVENOUS | Status: DC
Start: 2020-11-24 — End: 2020-11-24
  Administered 2020-11-24: 1 m[IU]/min via INTRAVENOUS
  Filled 2020-11-24: qty 500

## 2020-11-24 MED ORDER — OXYTOCIN-SODIUM CHLORIDE 30-0.9 UT/500ML-% IV SOLN: 1.0000 m[IU]/min | INTRAVENOUS | Status: DC

## 2020-11-24 NOTE — Anesthesia Preprocedure Evaluation (Signed)
Anesthesia Evaluation    Reviewed: Allergy & Precautions, Patient's Chart, lab work & pertinent test results, Unable to perform ROS - Chart review only  Airway Mallampati: II  TM Distance: >3 FB Neck ROM: Full    Dental no notable dental hx.    Pulmonary neg pulmonary ROS,    Pulmonary exam normal breath sounds clear to auscultation       Cardiovascular hypertension, Pt. on medications Normal cardiovascular exam Rhythm:Regular Rate:Normal     Neuro/Psych negative neurological ROS     GI/Hepatic negative GI ROS, Neg liver ROS,   Endo/Other  negative endocrine ROS  Renal/GU negative Renal ROS     Musculoskeletal negative musculoskeletal ROS (+)   Abdominal (+) + obese,   Peds  Hematology negative hematology ROS (+)   Anesthesia Other Findings   Reproductive/Obstetrics (+) Pregnancy                             Anesthesia Physical Anesthesia Plan  ASA: 2  Anesthesia Plan: Epidural   Post-op Pain Management:    Induction:   PONV Risk Score and Plan:   Airway Management Planned:   Additional Equipment:   Intra-op Plan:   Post-operative Plan:   Informed Consent: I have reviewed the patients History and Physical, chart, labs and discussed the procedure including the risks, benefits and alternatives for the proposed anesthesia with the patient or authorized representative who has indicated his/her understanding and acceptance.       Plan Discussed with:   Anesthesia Plan Comments:         Anesthesia Quick Evaluation

## 2020-11-24 NOTE — Progress Notes (Signed)
Brittany Mayo is a 25 y.o. G1P0000 at [redacted]w[redacted]d.  Subjective: Mild-mod cramping.  Objective: BP 129/67   Pulse 89   Temp 98.1 F (36.7 C) (Oral)   Resp 15   Ht 5\' 4"  (1.626 m)   Wt 94.7 kg   LMP 03/02/2020 (Exact Date)   SpO2 99%   BMI 35.84 kg/m    FHT:  FHR: 135 bpm, variability: mod,  accelerations:  15x15,  decelerations:  none (occasionally tracing MHR) UC:   Q 2-7 minutes, mild-mod Dilation: 4.5 Effacement (%): 60 Station: -2 Presentation: Vertex Exam by:: [email or web address], CNM AROM small amount of clear fluid.   Labs: NA  Assessment / Plan: [redacted]w[redacted]d week IUP Labor: Early/IOL. Now AROM'd. Continue titrating pitocin to achieve adequate MVU's Fetal Wellbeing:  Category I Pain Control:  Comfort measures Anticipated MOD:  SVD  [redacted]w[redacted]d Katrinka Blazing, CNM 11/24/2020 6:07 PM

## 2020-11-24 NOTE — Discharge Summary (Signed)
Postpartum Discharge Summary       Patient Name: Brittany Mayo DOB: 1995-09-24 MRN: [id number]  Date of admission: 11/24/2020 Delivery date:11/24/2020  Delivering provider: Janet Berlin  Date of discharge: 11/26/2020  Admitting diagnosis: Gestational hypertension [O13.9] Intrauterine pregnancy: [redacted]w[redacted]d    Secondary diagnosis:  Active Problems:   Gestational hypertension   Vaginal delivery     Discharge diagnosis: Term Pregnancy Delivered and Gestational Hypertension                                              Post partum procedures: depo Augmentation: AROM, Pitocin, and IP Foley Complications: None  Hospital course: Induction of Labor With Vaginal Delivery   25y.o. yo G1P0000 at 370w0das admitted to the hospital 11/24/2020 for induction of labor.  Indication for induction: Gestational hypertension.  Patient had an uncomplicated labor course as follows: Membrane Rupture Time/Date: 5:47 PM ,11/24/2020   Delivery Method:Vaginal, Spontaneous  Episiotomy: None  Lacerations:  2nd degree;Labial;Perineal  Details of delivery can be found in separate delivery note.  Patient had a routine postpartum course. She was started on procardia 30 mg daily for mild range blood pressures. Patient is discharged home 11/26/20.  Newborn Data: Birth date:11/24/2020  Birth time:9:37 PM  Gender:Female  Living status:Living  Apgars:8 ,9  Weight:2435 g   Magnesium Sulfate received: No BMZ received: No Rhophylac:N/A MMR:N/A T-DaP:Given prenatally Flu: No Transfusion:No  Physical exam  Vitals:   11/25/20 1430 11/25/20 1520 11/25/20 2300 11/26/20 0640  BP: 118/70 (!) 120/58 121/70 127/78  Pulse: 92 82 80 75  Resp: '16 16 16 16  ' Temp: 97.7 F (36.5 C) 98.5 F (36.9 C) 97.9 F (36.6 C) 98 F (36.7 C)  TempSrc: Oral Oral Oral Oral  SpO2:  99% 99% 97%  Weight:      Height:       General: alert, cooperative, and no distress Lochia: appropriate Uterine Fundus: firm Incision: N/A DVT  Evaluation: No evidence of DVT seen on physical exam. Labs: Lab Results  Component Value Date   WBC 7.9 11/24/2020   HGB 11.9 (L) 11/24/2020   HCT 36.5 11/24/2020   MCV 82.4 11/24/2020   PLT 204 11/24/2020   CMP Latest Ref Rng & Units 11/24/2020  Glucose 70 - 99 mg/dL 127(H)  BUN 6 - 20 mg/dL 7  Creatinine 0.44 - 1.00 mg/dL 0.91  Sodium 135 - 145 mmol/L 134(L)  Potassium 3.5 - 5.1 mmol/L 3.8  Chloride 98 - 111 mmol/L 106  CO2 22 - 32 mmol/L 19(L)  Calcium 8.9 - 10.3 mg/dL 8.9  Total Protein 6.5 - 8.1 g/dL 6.2(L)  Total Bilirubin 0.3 - 1.2 mg/dL 0.4  Alkaline Phos 38 - 126 U/L 131(H)  AST 15 - 41 U/L 32  ALT 0 - 44 U/L 25   Edinburgh Score: Edinburgh Postnatal Depression Scale Screening Tool 11/25/2020  I have been able to laugh and see the funny side of things. 0  I have looked forward with enjoyment to things. 0  I have blamed myself unnecessarily when things went wrong. 0  I have been anxious or worried for no good reason. 0  I have felt scared or panicky for no good reason. 0  Things have been getting on top of me. 0  I have been so unhappy that I have had difficulty sleeping. 0  I have felt sad or miserable. 0  I have been so unhappy that I have been crying. 0  The thought of harming myself has occurred to me. 0  Edinburgh Postnatal Depression Scale Total 0     After visit meds:  Allergies as of 11/26/2020       Reactions   Peanut-containing Drug Products Hives, Rash        Medication List     STOP taking these medications    aspirin 81 MG EC tablet   ondansetron 4 MG disintegrating tablet Commonly known as: Zofran ODT       TAKE these medications    acetaminophen 325 MG tablet Commonly known as: Tylenol Take 2 tablets (650 mg total) by mouth every 4 (four) hours as needed (for pain scale < 4).   coconut oil Oil Apply 1 application topically as needed.   ibuprofen 600 MG tablet Commonly known as: ADVIL Take 1 tablet (600 mg total) by mouth every  6 (six) hours.   NIFEdipine 30 MG 24 hr tablet Commonly known as: ADALAT CC Take 1 tablet (30 mg total) by mouth daily.   PRENATAL VITAMIN PO Take by mouth.   senna-docusate 8.6-50 MG tablet Commonly known as: Senokot-S Take 2 tablets by mouth daily.         Discharge home in stable condition Infant Feeding: Bottle Infant Disposition:home with mother Discharge instruction: per After Visit Summary and Postpartum booklet. Activity: Advance as tolerated. Pelvic rest for 6 weeks.  Diet: routine diet Future Appointments: Future Appointments  Date Time Provider [place]  12/01/2020 10:50 AM [facility] NURSE [facility]  12/30/2020 11:30 AM Roma Schanz, CNM [facility]   Follow up Visit:   Please schedule this patient for a In person postpartum visit in 6 weeks with the following provider: Any provider. Additional Postpartum F/U:BP check 1 week  High risk pregnancy complicated by: HTN Delivery mode:  Vaginal, Spontaneous  Anticipated Birth Control:  Depo   11/26/2020 Janet Berlin, MD

## 2020-11-24 NOTE — Anesthesia Procedure Notes (Signed)
Epidural Patient location during procedure: OB Start time: 11/24/2020 7:36 PM End time: 11/24/2020 7:48 PM  Staffing Anesthesiologist: Lewie Loron, MD Performed: anesthesiologist   Preanesthetic Checklist Completed: patient identified, IV checked, risks and benefits discussed, monitors and equipment checked, pre-op evaluation and timeout performed  Epidural Patient position: sitting Prep: DuraPrep and site prepped and draped Patient monitoring: heart rate, continuous pulse ox and blood pressure Approach: midline Location: L2-L3 Injection technique: LOR air and LOR saline  Needle:  Needle type: Tuohy  Needle gauge: 17 G Needle length: 9 cm Needle insertion depth: 6 cm Catheter type: closed end flexible Catheter size: 19 Gauge Catheter at skin depth: 11 cm Test dose: negative  Assessment Sensory level: T8 Events: blood not aspirated, injection not painful, no injection resistance, no paresthesia and negative IV test  Additional Notes Reason for block:procedure for pain

## 2020-11-24 NOTE — MAU Note (Signed)
Presents for direct admit to L&D for IOL.  Denies VB or LOF.  Endorses +FM.

## 2020-11-24 NOTE — H&P (Signed)
Obstetric History and Physical  Brittany Mayo is a 25 y.o. G1P0 with IUP at [redacted]w[redacted]d presenting for IOL for [facility] and concern about elevated UA dopplers noted on 11/14/20 scan (normal on 11/21/20 follow up scan). Accompanied by her mother today.  Patient states she has been having no contractions,  no  vaginal bleeding, intact membranes, with active fetal movement.    Prenatal Course [facility]  Pregnancy complications or risks: Patient Active Problem List   Diagnosis Date Noted   Gestational hypertension 11/06/2020   Supervision of high risk pregnancy, antepartum 06/03/2020   11/14/2020 [place] 35+4 wks,cephalic,anterior placenta gr 3,fhr 144 bpm,afi 13.7 cm,BPP 6/8 (0 for tone),RI .67,.72,.69,.66=91%,EFW 2916 g 71%,AC 94   [facility] RESULTS  Language English Pap 06/24/20: neg  Initiated care at 12wks GC/CT Initial:  -/-          36wks: -/-  Dating by 6wk U/S    Support person Mom Genetics NT/IT: NT normal, 1st IT was not ordered    AFP: neg     Panorama: low-risk female    Carrier Screen declined  Flu vaccine  Placer/Hgb Elec neg  TDaP vaccine 09/11/20    Rhogam n/a Blood Type O/Positive/-- (01/11 1059)    Antibody Negative (01/11 1059)  Anatomy US Normal female '[place]' HBsAg Negative (01/11 1059)  Feeding Plan bottle RPR Non Reactive (01/11 1059)  Contraception undecided Rubella  2.14 (01/11 1059)  Circumcision n/a HIV Non Reactive (01/11 1059)  Pediatrician [facility] Hep C Negative (01/11 1059)  Prenatal Classes discussed      A1C/GTT 26-28wks: normal  BTL Consent n/a    VBAC Consent n/a GBS  Negative       She plans to bottle feed She desires  is undecided  for postpartum contraception.   Past Medical History:  Diagnosis Date   Medical history non-contributory     Past Surgical History:  Procedure Laterality Date   NO PAST SURGERIES      OB History  Gravida Para Term Preterm AB Living  1 0 0 0 0 0  SAB IAB Ectopic Multiple Live Births  0 0 0 0 0     # Outcome Date GA Lbr Len/2nd Weight Sex Delivery Anes PTL Lv  1 Current             Social History   Socioeconomic History   Marital status: Single    Spouse name: Not on file   Number of children: Not on file   Years of education: Not on file   Highest education level: Not on file  Occupational History   Not on file  Tobacco Use   Smoking status: Never   Smokeless tobacco: Never  Vaping Use   Vaping Use: Never used  Substance and Sexual Activity   Alcohol use: Not Currently   Drug use: No   Sexual activity: Yes    Birth control/protection: None  Other Topics Concern   Not on file  Social History Narrative   Not on file   Social Determinants of Health   Financial Resource Strain: Low Risk    Difficulty of Paying Living Expenses: Not hard at all  Food Insecurity: No Food Insecurity   Worried About Programme researcher, broadcasting/film/video in the Last Year: Never true   Ran Out of Food in the Last Year: Never true  Transportation Needs: No Transportation Needs   Lack of Transportation (Medical): No   Lack of Transportation (Non-Medical): No  Physical Activity: Insufficiently Active   Days of Exercise per Week: 2 days   Minutes of Exercise per Session: 20 min  Stress: No Stress Concern Present   Feeling of Stress : Not at all  Social Connections: Moderately Isolated   Frequency of Communication with Friends and Family: More than three times a week   Frequency of Social Gatherings with Friends and Family: More than three times a week   Attends Religious Services: More than 4 times per year   Active Member of [employer] or Organizations: No   Attends Engineer, structural: Never   Marital Status: Never married    Family History  Problem Relation Age of Onset   Healthy Mother    Healthy Father    Healthy Sister    Healthy Sister    Healthy Sister     Medications Prior to Admission  Medication Sig Dispense Refill Last Dose   aspirin 81 MG EC tablet Take 1 tablet (81 mg total)  by mouth daily. Swallow whole. 90 tablet 3    ondansetron (ZOFRAN ODT) 4 MG disintegrating tablet Take 1 tablet (4 mg total) by mouth every 6 (six) hours as needed for nausea. (Patient not taking: Reported on 11/21/2020) 30 tablet 2    Prenatal Vit-Fe Fumarate-FA (PRENATAL VITAMIN PO) Take by mouth.       Allergies  Allergen Reactions   Peanut-Containing Drug Products Hives and Rash    Review of Systems: Negative except for what is mentioned in HPI.  Physical Exam: BP (!) 150/82   Pulse (!) 122   Temp 98.2 F (36.8 C) (Oral)   Resp 20   Ht  (1.626 m)   Wt 94.7 kg   LMP 03/02/2020 (Exact Date)   SpO2 99%   BMI 35.84 kg/m  CONSTITUTIONAL: Well-developed, well-nourished female in no acute distress.  HENT:  Normocephalic, atraumatic, External right and left ear normal.  EYES: Conjunctivae and EOM are normal. Pupils are equal, round, and reactive to light. No scleral icterus.  NECK: Normal range of motion, supple, no masses SKIN: Skin is warm and dry. No rash noted. Not diaphoretic. No erythema. No pallor. NEUROLOGIC: Alert and oriented to person, place, and time. Normal reflexes, muscle tone coordination. No cranial nerve deficit noted. PSYCHIATRIC: Normal mood and affect. Normal behavior. Normal judgment and thought content. CARDIOVASCULAR: Normal heart rate noted, regular rhythm RESPIRATORY: Effort and breath sounds normal, no problems with respiration noted ABDOMEN: Soft, nontender, nondistended, gravid. MUSCULOSKELETAL: Normal range of motion. No edema and no tenderness. 2+ distal pulses.  Cervical Exam: Dilatation 2 cm   Effacement 50%   Station -3   Presentation: cephalic on exam and verified on bedside ultrasound FHT:  Baseline rate 140 bpm   Variability moderate  Accelerations present   Decelerations none Contractions: None   Pertinent Labs/Studies:   Results for orders placed or performed during the hospital encounter of 11/24/20 (from the past 24 hour(s))  OB  RESULT CONSOLE Group B Strep     Status: None   Collection Time: 11/24/20 12:00 AM  Result Value Ref Range   GBS Negative   Type and screen     Status: None (Preliminary result)   Collection Time: 11/24/20 10:10 AM  Result Value Ref Range   ABO/RH(D) PENDING    Antibody Screen PENDING    Sample Expiration      11/27/2020,2359 Performed at [facility], [address] [place], [place] [address]   CBC  Status: Abnormal   Collection Time: 11/24/20 10:11 AM  Result Value Ref Range   WBC 7.9 4.0 - 10.5 K/uL   RBC 4.43 3.87 - 5.11 MIL/uL   Hemoglobin 11.9 (L) 12.0 - 15.0 g/dL   HCT 67.8 93.8 - 10.1 %   MCV 82.4 80.0 - 100.0 fL   MCH 26.9 26.0 - 34.0 pg   MCHC 32.6 30.0 - 36.0 g/dL   RDW 75.1 02.5 - 85.2 %   Platelets 204 150 - 400 K/uL   nRBC 0.0 0.0 - 0.2 %  Comprehensive metabolic panel     Status: Abnormal   Collection Time: 11/24/20 10:11 AM  Result Value Ref Range   Sodium 134 (L) 135 - 145 mmol/L   Potassium 3.8 3.5 - 5.1 mmol/L   Chloride 106 98 - 111 mmol/L   CO2 19 (L) 22 - 32 mmol/L   Glucose, Bld 127 (H) 70 - 99 mg/dL   BUN 7 6 - 20 mg/dL   Creatinine, Ser 7.78 0.44 - 1.00 mg/dL   Calcium 8.9 8.9 - 24.2 mg/dL   Total Protein 6.2 (L) 6.5 - 8.1 g/dL   Albumin 2.8 (L) 3.5 - 5.0 g/dL   AST 32 15 - 41 U/L   ALT 25 0 - 44 U/L   Alkaline Phosphatase 131 (H) 38 - 126 U/L   Total Bilirubin 0.4 0.3 - 1.2 mg/dL   GFR, Estimated >35 >36 mL/min   Anion gap 9 5 - 15   US OB Follow Up  Result Date: 11/16/2020 Formatting of this result is different from the original. Images from the original result were not included.  Center [facility] [address] [place] [address] FOLLOW UP SONOGRAM THULA STEWART is in the office for a follow up sonogram for EFW/BPP. She is a 25 y.o. year old G1P0000 with Estimated Date of Delivery: 12/15/20 by early ultrasound now at  [redacted]w[redacted]d weeks gestation. Thus far the pregnancy has been complicated by [facility].  GESTATION: SINGLETON PRESENTATION: cephalic FETAL ACTIVITY:          Heart rate         144          AMNIOTIC FLUID: The amniotic fluid volume is  normal, 13.7 cm. PLACENTA LOCALIZATION:  anterior GRADE 3 CERVIX: Limited view GESTATIONAL AGE AND  BIOMETRICS: Gestational criteria: Estimated Date of Delivery: 12/15/20 by early ultrasound now at [redacted]w[redacted]d Previous Scans:3          BIPARIETAL DIAMETER           8.76 cm         35+2 weeks HEAD CIRCUMFERENCE           31.76 cm         35+5 weeks ABDOMINAL CIRCUMFERENCE           33.45 cm         37+2 weeks   94% FEMUR LENGTH           6.79 cm         34+6 weeks                                                       AVERAGE EGA(BY THIS SCAN):  36 weeks  ESTIMATED FETAL WEIGHT:       2916  grams, 71 % BIOPHYSICAL PROFILE:                                                                                                      COMMENTS GROSS BODY MOVEMENT                 2  TONE                0  RESPIRATIONS                2  AMNIOTIC FLUID                2                                                          SCORE:  6/8 (Note: NST was not performed as part of this antepartum testing) DOPPLER FLOW STUDIES: UMBILICAL ARTERY RI RATIOS:   .67,.72,.69,.66=91% ANATOMICAL SURVEY                                                                            COMMENTS CEREBRAL VENTRICLES yes normal  CHOROID PLEXUS yes normal  CEREBELLUM yes normal  CISTERNA MAGNA  Yes  normal   CAVUM SEPTI PELLUCIDI YES NORMAL                  FACIAL PROFILE yes normal  4 CHAMBERED HEART yes normal  OUTFLOW TRACTS YES normaL  3VV YES NORMAL  3VTV YES NORMAL  SITUS YES NORMAL      DIAPHRAGM yes normal  STOMACH yes normal  RENAL REGION yes normal  BLADDER yes normal          3 VESSEL CORD yes normal  SPINE yes normal  ARMS/HANDS yes normal  LEGS/FEET yes normal  GENITALIA yes normal female     SUSPECTED ABNORMALITIES:  BPP 6/8 (0 for tone) Elevated UAD 91% W/EDF  QUALITY OF SCAN: satisfactory TECHNICIAN COMMENTS: [place] 35+4 wks,cephalic,anterior placenta gr 3,fhr 144 bpm,afi 13.7 cm,BPP 6/8 (0 for tone),RI .67,.72,.69,.66=91%,EFW 2916 g 71%,AC 94%,discussed results with [name] A copy of this report including all images has been saved and backed up to a second source for retrieval if needed. All measures and details of the anatomical scan, placentation, fluid volume and pelvic anatomy are contained in that report. Amber Flora Lipps 11/14/2020 12:01 PM Clinical Impression and recommendations: I have reviewed the sonogram results above, combined with the patient's current clinical course, below are my impressions and any appropriate recommendations for management based on the sonographic  findings. 1.  G1P0000 Estimated Date of Delivery: 12/15/20 by serial sonographic evaluations 2.  Fetal sonographic surveillance findings: a). Normal fluid volume b).  BPP 6/8, no breathing c). Statistically elevated fetal Doppler ratios with consistent diastolic flow:  91% d). Normal growth percentile with appropriate interval growth:  71% 3.  Normal general sonographic findings Recommend NST during office visit Lazaro ArmsLuther H Eure 11/16/2020 2:12 PM   US Fetal BPP W/O Non Stress  Result Date: 11/24/2020 Formatting of this result is different from the original. Images from the original result were not included.  [facility] [address] [place] [id number] FOLLOW UP SONOGRAM Brittany AlamoYomya O Mayo is in the office for a follow up sonogram for BPP and cord dopplers. She is a 25 y.o. year old G1P0000 with Estimated Date of Delivery: 12/15/20 by early ultrasound now at  2137w4d weeks gestation. Thus [facility] GESTATION: SINGLETON PRESENTATION: cephalic FETAL ACTIVITY:          Heart rate         146          The fetus is active. AMNIOTIC FLUID: The amniotic fluid volume is  normal, 11.5 cm. PLACENTA LOCALIZATION:  anterior GRADE 3 CERVIX:  Limited view Previous Scans:4 BIOPHYSICAL PROFILE:                                                                                                      COMMENTS GROSS BODY MOVEMENT                 2  TONE                2  RESPIRATIONS                2  AMNIOTIC FLUID                2                                                          SCORE:  8/8 (Note: NST was not performed as part of this antepartum testing) DOPPLER FLOW STUDIES: UMBILICAL ARTERY RI RATIOS:   .66,.62,.60,.65,.63=76% ANATOMICAL SURVEY                                                                            COMMENTS CEREBRAL VENTRICLES yes normal  CHOROID PLEXUS yes normal  CEREBELLUM yes normal      CAVUM SEPTI PELLUCIDI YES NORMAL  FACIAL PROFILE yes normal  4 CHAMBERED HEART yes normal  OUTFLOW TRACTS YES normaL  3VV YES NORMAL  3VTV YES NORMAL  SITUS YES NORMAL      DIAPHRAGM yes normal  STOMACH yes normal  RENAL REGION yes normal  BLADDER yes normal          3 VESSEL CORD yes normal              GENITALIA   female     SUSPECTED ABNORMALITIES:  no QUALITY OF SCAN: satisfactory TECHNICIAN COMMENTS: [place] 34+4 wks,cephalic,BPP 8/8,anterior placenta gr 3,RI .66,.62,.60,.65,.63=76%,AFI 11.5 cm A copy of this report including all images has been saved and backed up to a second source for retrieval if needed. All measures and details of the anatomical scan, placentation, fluid volume and pelvic anatomy are contained in that report. Amber Flora Lipps 11/21/2020 1:15 PM Clinical Impression and recommendations: I have reviewed the sonogram results above, combined with the patient's current clinical course, below are my impressions and any appropriate recommendations for management based on the sonographic findings. 1.  G1P0000 Estimated Date of Delivery: 12/15/20 by serial sonographic evaluations 2.  Fetal sonographic surveillance findings: a). Normal fluid volume b). Normal antepartum fetal assessment with BPP 8/8 c). Normal fetal Doppler  ratios with consistent diastolic flow:  76% 3.  Normal general sonographic findings Recommend continued prenatal evaluations and care based on this sonogram and as clinically indicated from the patient's clinical course. Lazaro Arms 11/24/2020 10:41 AM   [place] UA Cord Doppler  Result Date: 11/24/2020 Formatting of this result is different from the original. Images from the original result were not included.  Center for [facility] [address] [place] [address] FOLLOW UP SONOGRAM Brittany Mayo is in the office for a follow up sonogram for BPP and cord dopplers. She is a 25 y.o. year old G1P0000 with Estimated Date of Delivery: 12/15/20 by early ultrasound now at  [redacted]w[redacted]d weeks gestation. Thus far the pregnancy has been complicated by [facility]. GESTATION: SINGLETON PRESENTATION: cephalic FETAL ACTIVITY:          Heart rate         146          The fetus is active. AMNIOTIC FLUID: The amniotic fluid volume is  normal, 11.5 cm. PLACENTA LOCALIZATION:  anterior GRADE 3 CERVIX: Limited view Previous Scans:4 BIOPHYSICAL PROFILE:                                                                                                      COMMENTS GROSS BODY MOVEMENT                 2  TONE                2  RESPIRATIONS                2  AMNIOTIC FLUID                2  SCORE:  8/8 (Note: NST was not performed as part of this antepartum testing) DOPPLER FLOW STUDIES: UMBILICAL ARTERY RI RATIOS:   .66,.62,.60,.65,.63=76% ANATOMICAL SURVEY                                                                            COMMENTS CEREBRAL VENTRICLES yes normal  CHOROID PLEXUS yes normal  CEREBELLUM yes normal      CAVUM SEPTI PELLUCIDI YES NORMAL                  FACIAL PROFILE yes normal  4 CHAMBERED HEART yes normal  OUTFLOW TRACTS YES normaL  3VV YES NORMAL  3VTV YES NORMAL  SITUS YES NORMAL      DIAPHRAGM yes normal  STOMACH yes normal  RENAL REGION yes  normal  BLADDER yes normal          3 VESSEL CORD yes normal              GENITALIA   female     SUSPECTED ABNORMALITIES:  no QUALITY OF SCAN: satisfactory TECHNICIAN COMMENTS: [place] 34+4 wks,cephalic,BPP 8/8,anterior placenta gr 3,RI .66,.62,.60,.65,.63=76%,AFI 11.5 cm A copy of this report including all images has been saved and backed up to a second source for retrieval if needed. All measures and details of the anatomical scan, placentation, fluid volume and pelvic anatomy are contained in that report. Amber Flora Lipps 11/21/2020 1:15 PM Clinical Impression and recommendations: I have reviewed the sonogram results above, combined with the patient's current clinical course, below are my impressions and any appropriate recommendations for management based on the sonographic findings. 1.  G1P0000 Estimated Date of Delivery: 12/15/20 by serial sonographic evaluations 2.  Fetal sonographic surveillance findings: a). Normal fluid volume b). Normal antepartum fetal assessment with BPP 8/8 c). Normal fetal Doppler ratios with consistent diastolic flow:  76% 3.  Normal general sonographic findings Recommend continued prenatal evaluations and care based on this sonogram and as clinically indicated from the patient's clinical course. Lazaro Arms 11/24/2020 10:41 AM   [place] UA Cord Doppler  Result Date: 11/16/2020 Formatting of this result is different from the original. Images from the original result were not included.  Center for [facility] [address] [place] [address] FOLLOW UP SONOGRAM Brittany Mayo is in the office for a follow up sonogram for EFW/BPP. She is a 25 y.o. year old G1P0000 with Estimated Date of Delivery: 12/15/20 by early ultrasound now at  [redacted]w[redacted]d weeks gestation. Thus far the pregnancy has been complicated by [facility]. GESTATION: SINGLETON PRESENTATION: cephalic FETAL ACTIVITY:          Heart rate         144          AMNIOTIC FLUID: The amniotic fluid volume is  normal, 13.7 cm.  PLACENTA LOCALIZATION:  anterior GRADE 3 CERVIX: Limited view GESTATIONAL AGE AND  BIOMETRICS: Gestational criteria: Estimated Date of Delivery: 12/15/20 by early ultrasound now at [redacted]w[redacted]d Previous Scans:3          BIPARIETAL DIAMETER           8.76 cm         35+2 weeks HEAD CIRCUMFERENCE  31.76 cm         35+5 weeks ABDOMINAL CIRCUMFERENCE           33.45 cm         37+2 weeks   94% FEMUR LENGTH           6.79 cm         34+6 weeks                                                       AVERAGE EGA(BY THIS SCAN):  36 weeks                                                 ESTIMATED FETAL WEIGHT:       2916  grams, 71 % BIOPHYSICAL PROFILE:                                                                                                      COMMENTS GROSS BODY MOVEMENT                 2  TONE                0  RESPIRATIONS                2  AMNIOTIC FLUID                2                                                          SCORE:  6/8 (Note: NST was not performed as part of this antepartum testing) DOPPLER FLOW STUDIES: UMBILICAL ARTERY RI RATIOS:   .67,.72,.69,.66=91% ANATOMICAL SURVEY                                                                            COMMENTS CEREBRAL VENTRICLES yes normal  CHOROID PLEXUS yes normal  CEREBELLUM yes normal  CISTERNA MAGNA  Yes  normal   CAVUM SEPTI PELLUCIDI YES NORMAL                  FACIAL PROFILE yes normal  4 CHAMBERED HEART yes normal  OUTFLOW TRACTS YES normaL  3VV YES NORMAL  3VTV YES NORMAL  SITUS YES NORMAL  DIAPHRAGM yes normal  STOMACH yes normal  RENAL REGION yes normal  BLADDER yes normal          3 VESSEL CORD yes normal  SPINE yes normal  ARMS/HANDS yes normal  LEGS/FEET yes normal  GENITALIA yes normal female     SUSPECTED ABNORMALITIES:  BPP 6/8 (0 for tone) Elevated UAD 91% W/EDF QUALITY OF SCAN: satisfactory TECHNICIAN COMMENTS: [place] 35+4 wks,cephalic,anterior placenta gr 3,fhr 144 bpm,afi 13.7 cm,BPP 6/8 (0 for tone),RI .67,.72,.69,.66=91%,EFW  2916 g 71%,AC 94%,discussed results with [name] A copy of this report including all images has been saved and backed up to a second source for retrieval if needed. All measures and details of the anatomical scan, placentation, fluid volume and pelvic anatomy are contained in that report. Amber Flora Lipps 11/14/2020 12:01 PM Clinical Impression and recommendations: I have reviewed the sonogram results above, combined with the patient's current clinical course, below are my impressions and any appropriate recommendations for management based on the sonographic findings. 1.  G1P0000 Estimated Date of Delivery: 12/15/20 by serial sonographic evaluations 2.  Fetal sonographic surveillance findings: a). Normal fluid volume b).  BPP 6/8, no breathing c). Statistically elevated fetal Doppler ratios with consistent diastolic flow:  91% d). Normal growth percentile with appropriate interval growth:  71% 3.  Normal general sonographic findings Recommend NST during office visit Lazaro Arms 11/16/2020 2:12 PM   [place] MFM FETAL BPP WO NON STRESS  Result Date: 11/15/2020 ----------------------------------------------------------------------  OBSTETRICS REPORT                        (Signed Final 11/15/2020 09:13 am) ---------------------------------------------------------------------- Patient Info  ID #:       [id number]                          D.O.B.:  Dec 21, 1995 (24 yrs)  Name:       Brittany Mayo                   Visit Date: 11/14/2020 06:22 pm ---------------------------------------------------------------------- Performed By  Attending:        Lin Landsman      Referred By:       Sheila Oats                    MD  Performed By:     Carollee Leitz,          [facility] ---------------------------------------------------------------------- Orders  #  Description                           Code        Ordered By  1  [place] MFM  FETAL BPP WO NON               40981.19    ANNA GOSWICK     STRESS ----------------------------------------------------------------------  #  Order #  Accession #                Episode #  1  [id number]                   [id number]                 [id number] ---------------------------------------------------------------------- Indications  [redacted] weeks gestation of pregnancy                 Z3A.35  Abnormal finding on antenatal screening         O28.9 ---------------------------------------------------------------------- Fetal Evaluation  Num Of Fetuses:          1  Fetal Heart Rate(bpm):   155  Cardiac Activity:        Observed  Presentation:            Cephalic  Placenta:                Anterior  P. Cord Insertion:       Not well visualized  Amniotic Fluid  AFI FV:      Within normal limits  AFI Sum(cm)     %Tile       Largest Pocket(cm)  13.73           49          6.19  RUQ(cm)       RLQ(cm)       LUQ(cm)        LLQ(cm)  6.19          2.34          3.91           1.28 ---------------------------------------------------------------------- Biophysical Evaluation  Amniotic F.V:   Pocket => 2 cm             F. Tone:         Observed  F. Movement:    Observed                   Score:           8/8  F. Breathing:   Observed ---------------------------------------------------------------------- Gestational Age  LMP:           36w 5d        Date:  03/02/20                 EDD:   12/07/20  Clinical EDD:  35w 4d                                        EDD:   12/15/20  Best:          35w 4d     Det. By:  Clinical EDD             EDD:   12/15/20 ---------------------------------------------------------------------- Anatomy  Diaphragm:             Appears normal         Kidneys:                Appear normal  Stomach:               Appears normal, left   Bladder:                Appears normal  sided ---------------------------------------------------------------------- Cervix Uterus Adnexa   Cervix  Not visualized (advanced GA >24wks)  Uterus  No abnormality visualized.  Right Ovary  Not visualized.  Left Ovary  Not visualized.  Cul De Sac  No free fluid seen.  Adnexa  No adnexal mass visualized. ---------------------------------------------------------------------- Impression  Antenatal testing performed  The biophysical profile was 8/8 with good fetal movement and  amniotic fluid volume. ---------------------------------------------------------------------- Recommendations  Clinical correlation recommended ----------------------------------------------------------------------               Lin Landsman, MD Electronically Signed Final Report   11/15/2020 09:13 am ----------------------------------------------------------------------  US FETAL BPP W/NONSTRESS  Result Date: 11/16/2020 Formatting of this result is different from the original. Images from the original result were not included.  Center for [facility] [address] [place] [address] FOLLOW UP SONOGRAM Brittany Mayo is in the office for a follow up sonogram for EFW/BPP. She is a 25 y.o. year old G1P0000 with Estimated Date of Delivery: 12/15/20 by early ultrasound now at  [redacted]w[redacted]d weeks gestation. Thus far the pregnancy has been complicated by [facility]. GESTATION: SINGLETON PRESENTATION: cephalic FETAL ACTIVITY:          Heart rate         144          AMNIOTIC FLUID: The amniotic fluid volume is  normal, 13.7 cm. PLACENTA LOCALIZATION:  anterior GRADE 3 CERVIX: Limited view GESTATIONAL AGE AND  BIOMETRICS: Gestational criteria: Estimated Date of Delivery: 12/15/20 by early ultrasound now at [redacted]w[redacted]d Previous Scans:3          BIPARIETAL DIAMETER           8.76 cm         35+2 weeks HEAD CIRCUMFERENCE           31.76 cm         35+5 weeks ABDOMINAL CIRCUMFERENCE           33.45 cm         37+2 weeks   94% FEMUR LENGTH           6.79 cm         34+6 weeks                                                       AVERAGE  EGA(BY THIS SCAN):  36 weeks                                                 ESTIMATED FETAL WEIGHT:       2916  grams, 71 % BIOPHYSICAL PROFILE:  COMMENTS GROSS BODY MOVEMENT                 2  TONE                0  RESPIRATIONS                2  AMNIOTIC FLUID                2                                                          SCORE:  6/8 (Note: NST was not performed as part of this antepartum testing) DOPPLER FLOW STUDIES: UMBILICAL ARTERY RI RATIOS:   .67,.72,.69,.66=91% ANATOMICAL SURVEY                                                                            COMMENTS CEREBRAL VENTRICLES yes normal  CHOROID PLEXUS yes normal  CEREBELLUM yes normal  CISTERNA MAGNA  Yes  normal   CAVUM SEPTI PELLUCIDI YES NORMAL                  FACIAL PROFILE yes normal  4 CHAMBERED HEART yes normal  OUTFLOW TRACTS YES normaL  3VV YES NORMAL  3VTV YES NORMAL  SITUS YES NORMAL      DIAPHRAGM yes normal  STOMACH yes normal  RENAL REGION yes normal  BLADDER yes normal          3 VESSEL CORD yes normal  SPINE yes normal  ARMS/HANDS yes normal  LEGS/FEET yes normal  GENITALIA yes normal female     SUSPECTED ABNORMALITIES:  BPP 6/8 (0 for tone) Elevated UAD 91% W/EDF QUALITY OF SCAN: satisfactory TECHNICIAN COMMENTS: [place] 35+4 wks,cephalic,anterior placenta gr 3,fhr 144 bpm,afi 13.7 cm,BPP 6/8 (0 for tone),RI .67,.72,.69,.66=91%,EFW 2916 g 71%,AC 94%,discussed results with [name] A copy of this report including all images has been saved and backed up to a second source for retrieval if needed. All measures and details of the anatomical scan, placentation, fluid volume and pelvic anatomy are contained in that report. Amber Flora Lipps 11/14/2020 12:01 PM Clinical Impression and recommendations: I have reviewed the sonogram results above, combined with the patient's current clinical course, below are my impressions and any  appropriate recommendations for management based on the sonographic findings. 1.  G1P0000 Estimated Date of Delivery: 12/15/20 by serial sonographic evaluations 2.  Fetal sonographic surveillance findings: a). Normal fluid volume b).  BPP 6/8, no breathing c). Statistically elevated fetal Doppler ratios with consistent diastolic flow:  91% d). Normal growth percentile with appropriate interval growth:  71% 3.  Normal general sonographic findings Recommend NST during office visit Lazaro Arms 11/16/2020 2:12 PM    Assessment : SHAQUANDA GRAVES is a 25 y.o. G1P0000 at [redacted]w[redacted]d being admitted for induction of labor due to [facility] and elevated UA dopplers (91% on 11/14/20) but normal UA dopplers (76% on 11/21/20)  Plan: Induction of labor: Will start with CST. If negative,  will proceed with foley cervical ripening and pitocin per protocol. Analgesia as needed. GHTN: BP stable, continue close monitoring FWB:  Category 1 fetal heart tracing.  GBS negative Delivery plan: Hopeful for vaginal delivery    Jaynie Collins, MD, FACOG Obstetrician & Gynecologist, [facility] for [employer], [facility]

## 2020-11-24 NOTE — Progress Notes (Signed)
MSE completed by Ivonne Andrew, CNM prior to pt going to L&D for IOL.

## 2020-11-24 NOTE — Lactation Note (Signed)
This note was copied from a baby's chart. Lactation Consultation Note  Patient Name: Girl Aasia Peavler [id number] Date: 11/24/2020 Reason for consult: L&D Initial assessment;Early term 37-38.6wks Age:24 hours  L&D consult with >60 minutes old infant and P1 mother. Support people present at time of consult. Congratulated them on their newborn. Assisted with skin to skin prone on mother's chest. Hand expressed and infant latched immediately with little assistance. Discussed STS as ideal transition for infants after birth helping with temperature, blood sugar and comfort. Talked about primal reflexes such as rooting, hands to mouth, searching for the breast among others.  Explained LC services availability during postpartum stay. Thanked family for their time.    Maternal Data Has patient been taught Hand Expression?: Yes Does the patient have breastfeeding experience prior to this delivery?: No  Feeding Mother's Current Feeding Choice: Breast Milk  LATCH Score Latch: Grasps breast easily, tongue down, lips flanged, rhythmical sucking.  Audible Swallowing: A few with stimulation  Type of Nipple: Everted at rest and after stimulation  Comfort (Breast/Nipple): Soft / non-tender  Hold (Positioning): Assistance needed to correctly position infant at breast and maintain latch.  LATCH Score: 8   Lactation Tools Discussed/Used    Interventions Interventions: Breast feeding basics reviewed;Assisted with latch;Skin to skin;Hand express;Breast massage;Expressed milk;Education  Discharge Pump: Personal WIC Program: No  Consult Status Consult Status: Follow-up Date: 11/25/20 Follow-up type: In-patient    Audia Amick A Higuera Ancidey 11/24/2020, 10:39 PM

## 2020-11-24 NOTE — Progress Notes (Signed)
Brittany Mayo is a 25 y.o. G1P0000 at [redacted]w[redacted]d.  Subjective: Mild cramping  Objective: BP 135/76   Pulse 86   Temp 98.2 F (36.8 C) (Oral)   Resp 20   Ht 5\' 4"  (1.626 m)   Wt 94.7 kg   LMP 03/02/2020 (Exact Date)   SpO2 99%   BMI 35.84 kg/m    FHT:  FHR: 135 bpm, variability: mod,  accelerations:  15x15,  decelerations:  none UC:   Q 2-7 minutes, mild Dilation: 3.5 Effacement (%): 60 Station: -2 Presentation: Vertex Exam by:: V Cynthie Garmon CNM  Labs: Results for orders placed or performed during the hospital encounter of 11/24/20 (from the past 24 hour(s))  OB RESULT CONSOLE Group B Strep     Status: None   Collection Time: 11/24/20 12:00 AM  Result Value Ref Range   GBS Negative   Type and screen     Status: None   Collection Time: 11/24/20 10:10 AM  Result Value Ref Range   ABO/RH(D) O POS    Antibody Screen NEG    Sample Expiration      11/27/2020,2359 Performed at [facility], [address], [place], [place]   CBC     Status: Abnormal   Collection Time: 11/24/20 10:11 AM  Result Value Ref Range   WBC 7.9 4.0 - 10.5 K/uL   RBC 4.43 3.87 - 5.11 MIL/uL   Hemoglobin 11.9 (L) 12.0 - 15.0 g/dL   HCT 01/25/21 99.7 - 74.1 %   MCV 82.4 80.0 - 100.0 fL   MCH 26.9 26.0 - 34.0 pg   MCHC 32.6 30.0 - 36.0 g/dL   RDW 42.3 95.3 - 20.2 %   Platelets 204 150 - 400 K/uL   nRBC 0.0 0.0 - 0.2 %  Comprehensive metabolic panel     Status: Abnormal   Collection Time: 11/24/20 10:11 AM  Result Value Ref Range   Sodium 134 (L) 135 - 145 mmol/L   Potassium 3.8 3.5 - 5.1 mmol/L   Chloride 106 98 - 111 mmol/L   CO2 19 (L) 22 - 32 mmol/L   Glucose, Bld 127 (H) 70 - 99 mg/dL   BUN 7 6 - 20 mg/dL   Creatinine, Ser 01/25/21 0.44 - 1.00 mg/dL   Calcium 8.9 8.9 - 3.56 mg/dL   Total Protein 6.2 (L) 6.5 - 8.1 g/dL   Albumin 2.8 (L) 3.5 - 5.0 g/dL   AST 32 15 - 41 U/L   ALT 25 0 - 44 U/L   Alkaline Phosphatase 131 (H) 38 - 126 U/L   Total Bilirubin 0.4 0.3 - 1.2 mg/dL   GFR,  Estimated 86.1 >68 mL/min   Anion gap 9 5 - 15  Protein / creatinine ratio, urine     Status: None   Collection Time: 11/24/20 10:11 AM  Result Value Ref Range   Creatinine, Urine 44.34 mg/dL   Total Protein, Urine <6 mg/dL   Protein Creatinine Ratio        0.00 - 0.15 mg/mg[Cre]  Resp Panel by RT-PCR (Flu A&B, Covid) Nasopharyngeal Swab     Status: None   Collection Time: 11/24/20 11:06 AM   Specimen: Nasopharyngeal Swab; Nasopharyngeal(NP) swabs in vial transport medium  Result Value Ref Range   SARS Coronavirus 2 by RT PCR NEGATIVE NEGATIVE   Influenza A by PCR NEGATIVE NEGATIVE   Influenza B by PCR NEGATIVE NEGATIVE    Assessment / Plan: [redacted]w[redacted]d week IUP Labor: Early/IOL. Neg CST.  Will titrate pitocin to achieve adequate labor.   Fetal Wellbeing:  Category I Pain Control:  Comfort measures Anticipated MOD:  SVD  Katrinka Blazing [place], CNM 11/24/2020 2:25 PM

## 2020-11-25 ENCOUNTER — Encounter (HOSPITAL_COMMUNITY): Payer: Self-pay | Admitting: Obstetrics & Gynecology

## 2020-11-25 LAB — CULTURE, BETA STREP (GROUP B ONLY): Strep Gp B Culture: NEGATIVE

## 2020-11-25 LAB — RPR: RPR Ser Ql: NONREACTIVE

## 2020-11-25 MED ORDER — ONDANSETRON HCL 4 MG PO TABS: 4.0000 mg | ORAL_TABLET | ORAL | Status: DC | PRN

## 2020-11-25 MED ORDER — NIFEDIPINE ER OSMOTIC RELEASE 30 MG PO TB24
30.0000 mg | ORAL_TABLET | Freq: Every day | ORAL | Status: DC
Start: 2020-11-25 — End: 2020-11-26
  Administered 2020-11-25 – 2020-11-26 (×2): 30 mg via ORAL
  Filled 2020-11-25 (×2): qty 1

## 2020-11-25 MED ORDER — SENNOSIDES-DOCUSATE SODIUM 8.6-50 MG PO TABS
2.0000 | ORAL_TABLET | ORAL | Status: DC
  Administered 2020-11-25 – 2020-11-26 (×2): 2 via ORAL
  Filled 2020-11-25 (×2): qty 2

## 2020-11-25 MED ORDER — ACETAMINOPHEN 325 MG PO TABS: 650.0000 mg | ORAL_TABLET | ORAL | Status: DC | PRN

## 2020-11-25 MED ORDER — IBUPROFEN 600 MG PO TABS
600.0000 mg | ORAL_TABLET | Freq: Four times a day (QID) | ORAL | Status: DC
  Administered 2020-11-25 – 2020-11-26 (×6): 600 mg via ORAL
  Filled 2020-11-25 (×6): qty 1

## 2020-11-25 MED ORDER — MEASLES, MUMPS & RUBELLA VAC IJ SOLR: 0.5000 mL | Freq: Once | INTRAMUSCULAR | Status: DC

## 2020-11-25 MED ORDER — DIPHENHYDRAMINE HCL 25 MG PO CAPS: 25.0000 mg | ORAL_CAPSULE | Freq: Four times a day (QID) | ORAL | Status: DC | PRN

## 2020-11-25 MED ORDER — ONDANSETRON HCL 4 MG/2ML IJ SOLN: 4.0000 mg | INTRAMUSCULAR | Status: DC | PRN

## 2020-11-25 MED ORDER — WITCH HAZEL-GLYCERIN EX PADS: 1.0000 "application " | MEDICATED_PAD | CUTANEOUS | Status: DC | PRN

## 2020-11-25 MED ORDER — DOCUSATE SODIUM 100 MG PO CAPS
100.0000 mg | ORAL_CAPSULE | Freq: Two times a day (BID) | ORAL | Status: DC
  Administered 2020-11-25 – 2020-11-26 (×3): 100 mg via ORAL
  Filled 2020-11-25 (×3): qty 1

## 2020-11-25 MED ORDER — MEDROXYPROGESTERONE ACETATE 150 MG/ML IM SUSP
150.0000 mg | INTRAMUSCULAR | Status: AC | PRN
  Administered 2020-11-26: 150 mg via INTRAMUSCULAR
  Filled 2020-11-25: qty 1

## 2020-11-25 MED ORDER — PRENATAL MULTIVITAMIN CH
1.0000 | ORAL_TABLET | Freq: Every day | ORAL | Status: DC
  Administered 2020-11-25 – 2020-11-26 (×2): 1 via ORAL
  Filled 2020-11-25 (×2): qty 1

## 2020-11-25 MED ORDER — BENZOCAINE-MENTHOL 20-0.5 % EX AERO
1.0000 "application " | INHALATION_SPRAY | CUTANEOUS | Status: DC | PRN
  Administered 2020-11-25: 1 via TOPICAL
  Filled 2020-11-25: qty 56

## 2020-11-25 MED ORDER — SIMETHICONE 80 MG PO CHEW: 80.0000 mg | CHEWABLE_TABLET | ORAL | Status: DC | PRN

## 2020-11-25 MED ORDER — TETANUS-DIPHTH-ACELL PERTUSSIS 5-2.5-18.5 LF-MCG/0.5 IM SUSY: 0.5000 mL | PREFILLED_SYRINGE | Freq: Once | INTRAMUSCULAR | Status: DC

## 2020-11-25 MED ORDER — DIBUCAINE (PERIANAL) 1 % EX OINT: 1.0000 "application " | TOPICAL_OINTMENT | CUTANEOUS | Status: DC | PRN

## 2020-11-25 MED ORDER — COCONUT OIL OIL
1.0000 | TOPICAL_OIL | Status: DC | PRN
Start: 2020-11-25 — End: 2020-11-26

## 2020-11-25 NOTE — Lactation Note (Signed)
This note was copied from a baby's chart. Lactation Consultation Note  Patient Name: Brittany Mayo [id number] Date: 11/25/2020 Reason for consult: Follow-up assessment;Mother's request;Difficult latch;Primapara;1st time breastfeeding;Early term 37-38.6wks;Infant < 6lbs;Other (Comment) (GHTN) Age:25 hours  Mom having trouble latching nipples are flat. Nipples will evert with stimulation. Mom provided with breast shells and set up on DEBP but has not put on her breast shells and only pumped once. LC reviewed with Mother importance of pumping q 3 hrs for 15 min after latching to maintain her milk supply.   LC also reviewed importance of wearing her breast shells when she is not pumping, sleeping or nursing.   LC reviewed behavior of LPTI and to keep hat on all times, keep infant STS at the breasts and keep total feeding under 30 minutes.  Infant thick labial attachment and high palate. With suck training we worked on getting her to extend her tongue she tend to keep it bunched up and his biting with her gums.  Mom no signs of nipple trauma stating infant not able to latch for more than 5 minutes.   Mom to call for assistance with next feeding to work on latching.   Plan 1. To feed based on cues 8-12x in 24 hr period no more than 3 hrs without an attempt. Mom to offer both breasts and look for swallows with breast compression. LC reviewed latching positions and to ensure lips are flanged out at the breasts.          2. Mom to supplement based on LPTI guidelines offer EBM first then formula. If infant not latching, Mom aware offer more with paced bottle feeding with extra slow flow nipple.  3. Mom to pump with DEBP q 3 hrs for 15 min  All questions answered at the end of the visit.   Maternal Data Has patient been taught Hand Expression?: Yes  Feeding Mother's Current Feeding Choice: Breast Milk and Formula  LATCH Score                    Lactation Tools Discussed/Used Tools:  Pump;Flanges;Shells Flange Size: 24 Breast pump type: Double-Electric Breast Pump Pump Education: Setup, frequency, and cleaning;Milk Storage Reason for Pumping: increase stimulation Pumping frequency: every 3 hrs  for 15 min  Interventions Interventions: Breast feeding basics reviewed;Skin to skin;Support pillows;Breast massage;Position options;Hand express;Expressed milk;Education;Shells;DEBP  Discharge    Consult Status Consult Status: Follow-up Date: 11/26/20 Follow-up type: In-patient    Marysol Wellnitz  Nicholson-Springer 11/25/2020, 5:39 PM

## 2020-11-25 NOTE — Anesthesia Postprocedure Evaluation (Signed)
Anesthesia Post Note  Patient: Brittany Mayo  Procedure(s) Performed: AN AD HOC LABOR EPIDURAL     Patient location during evaluation: Mother Baby Anesthesia Type: Epidural Level of consciousness: awake and alert Pain management: pain level controlled Vital Signs Assessment: post-procedure vital signs reviewed and stable Respiratory status: spontaneous breathing, nonlabored ventilation and respiratory function stable Cardiovascular status: stable Postop Assessment: no headache, no backache, epidural receding, no apparent nausea or vomiting, patient able to bend at knees, adequate PO intake and able to ambulate Anesthetic complications: no   No notable events documented.  Last Vitals:  Vitals:   11/25/20 0145 11/25/20 0612  BP: 133/74 130/65  Pulse: 81 74  Resp: 18 16  Temp: 36.7 C 36.6 C  SpO2: 99% 100%    Last Pain:  Vitals:   11/25/20 0612  TempSrc: Oral  PainSc:    Pain Goal:                   Laban Emperor

## 2020-11-25 NOTE — Lactation Note (Signed)
This note was copied from a baby's chart. Lactation Consultation Note Mom giving formula when LC came into rm. Mom stated baby tried to latch after delivery but really didn't well. Mom stated her feeding choice is BF/formula feeding.  Discussed w/mom about pumping. Mom agreed.  Mom shown how to use DEBP & how to disassemble, clean, & reassemble parts. Mom knows to pump q3h for 15-20 min. Fit flanges #24 is ok. Offered mom to pump now, mom declined stating she will wait until the morning after she rest. Mom encouraged to feed baby 8-12 times/24 hours and with feeding cues.    Mom has flat nipples. Shells strongly encouraged to wear this morning after she rest.  LPI information sheet given discussing behavior, feeding habits, the importance of I&O and STS.  Encouraged mom to call for latch assistance or questions. Lactation brochure given.  Patient Name: Brittany Mayo RAXEN'M Date: 11/25/2020 Reason for consult: Initial assessment;Primapara;Infant < 6lbs;Early term 37-38.6wks Age:24 hours  Maternal Data Does the patient have breastfeeding experience prior to this delivery?: No  Feeding Nipple Type: Extra Slow Flow  LATCH Score       Type of Nipple: Flat  Comfort (Breast/Nipple): Soft / non-tender         Lactation Tools Discussed/Used Tools: Shells;Pump Breast pump type: Double-Electric Breast Pump Pump Education: Setup, frequency, and cleaning;Milk Storage Reason for Pumping: less than 6 lbs Pumping frequency: q 3 h  Interventions Interventions: Breast feeding basics reviewed;Shells;DEBP;Pre-pump if needed  Discharge [facility] Program: No  Consult Status Consult Status: Follow-up Date: 11/25/20 Follow-up type: In-patient    Charyl Dancer 11/25/2020, 5:42 AM

## 2020-11-25 NOTE — Progress Notes (Signed)
POSTPARTUM PROGRESS NOTE  Subjective: Brittany Mayo is a 25 y.o. G1P1001 s/p SVD at [redacted]w[redacted]d.  She reports she doing well. No acute events overnight. She denies any problems with ambulating, voiding or po intake. Denies nausea or vomiting. She has passed flatus. Pain is well controlled.  Lochia is mild.  Objective: Blood pressure 130/65, pulse 74, temperature 97.9 F (36.6 C), temperature source Oral, resp. rate 16, height 5\' 4"  (1.626 m), weight 94.7 kg, last menstrual period 03/02/2020, SpO2 100 %, unknown if currently breastfeeding.  Physical Exam:  General: alert, cooperative and no distress Chest: no respiratory distress Abdomen: soft, appropriately-tender  Uterine Fundus: firm and at level of umbilicus Extremities: No calf swelling or tenderness  no edema  Recent Labs    11/24/20 1011  HGB 11.9*  HCT 36.5    Assessment/Plan: Brittany Mayo is a 25 y.o. G1P1001 s/p SVD at [redacted]w[redacted]d.  Routine Postpartum Care: Doing well, pain well-controlled.  -- Continue routine care, lactation support  -- Contraception: desires depo -- Feeding: breast/bottle   --Gestational HTN: Mild range to normotensive pressures since delivery. PreE labs normal on 7/4. Started on procardia this AM, continue to monitor.    Dispo: Plan for discharge PPD#2.  9/4, MD OB Fellow, [facility] 11/25/2020 7:48 AM

## 2020-11-26 ENCOUNTER — Other Ambulatory Visit (HOSPITAL_COMMUNITY): Payer: Self-pay

## 2020-11-26 LAB — CERVICOVAGINAL ANCILLARY ONLY
Chlamydia: NEGATIVE
Comment: NEGATIVE
Comment: NORMAL
Neisseria Gonorrhea: NEGATIVE

## 2020-11-26 MED ORDER — NIFEDIPINE ER 30 MG PO TB24
30.0000 mg | ORAL_TABLET | Freq: Every day | ORAL | 0 refills | Status: DC
  Filled 2020-11-26: qty 30, 30d supply, fill #0

## 2020-11-26 MED ORDER — ACETAMINOPHEN 325 MG PO TABS
650.0000 mg | ORAL_TABLET | ORAL | 0 refills | Status: DC | PRN
  Filled 2020-11-26: qty 30, 3d supply, fill #0

## 2020-11-26 MED ORDER — IBUPROFEN 600 MG PO TABS
600.0000 mg | ORAL_TABLET | Freq: Four times a day (QID) | ORAL | 0 refills | Status: DC
  Filled 2020-11-26: qty 30, 8d supply, fill #0

## 2020-11-26 MED ORDER — SENNOSIDES-DOCUSATE SODIUM 8.6-50 MG PO TABS: 2.0000 | ORAL_TABLET | ORAL | Status: DC

## 2020-11-26 MED ORDER — COCONUT OIL OIL: 1.0000 "application " | TOPICAL_OIL | 0 refills | Status: DC | PRN

## 2020-12-01 ENCOUNTER — Other Ambulatory Visit: Payer: Self-pay

## 2020-12-01 ENCOUNTER — Ambulatory Visit (INDEPENDENT_AMBULATORY_CARE_PROVIDER_SITE_OTHER): Payer: Medicaid Other

## 2020-12-01 VITALS — BP 129/86 | HR 72 | Wt 191.0 lb

## 2020-12-01 DIAGNOSIS — R030 Elevated blood-pressure reading, without diagnosis of hypertension: Secondary | ICD-10-CM

## 2020-12-01 NOTE — Progress Notes (Addendum)
   NURSE VISIT- BLOOD PRESSURE CHECK  SUBJECTIVE:  Brittany Mayo is a 25 y.o. G45P1001 female here for BP check. She is postpartum, delivery date 11/24/20     HYPERTENSION ROS:  Pregnant/postpartum:  Severe headaches that don't go away with tylenol/other medicines: No  Visual changes (seeing spots/double/blurred vision) No  Severe pain under right breast breast or in center of upper chest No  Severe nausea/vomiting No  Taking medicines as instructed yes  OBJECTIVE:  BP 129/86 (BP Location: Left Arm, Patient Position: Sitting, Cuff Size: Normal)   Pulse 72   Wt 191 lb (86.6 kg)   LMP 03/02/2020 (Exact Date)   Breastfeeding Yes Comment: Bottle supplementation  BMI 32.79 kg/m   Appearance alert, well appearing, and in no distress, oriented to person, place, and time, and normal appearing weight.  ASSESSMENT: Postpartum  blood pressure check  PLAN: Discussed with [name], CNM, [facility]   Recommendations: stop medicine 2 days before next visit   Follow-up: as scheduled   Sian Joles A Canden Cieslinski  12/01/2020 11:24 AM   Chart reviewed for nurse visit. Agree with plan of care.  Cheral Marker, [place] 12/01/2020 1:04 PM

## 2020-12-04 ENCOUNTER — Telehealth (HOSPITAL_COMMUNITY): Payer: Self-pay | Admitting: *Deleted

## 2020-12-04 NOTE — Telephone Encounter (Signed)
Left message to return nurse call. Duffy Rhody, RN 12/04/2020 at 11:20am

## 2020-12-05 ENCOUNTER — Other Ambulatory Visit: Payer: Medicaid Other

## 2020-12-05 ENCOUNTER — Encounter: Payer: Medicaid Other | Admitting: Obstetrics & Gynecology

## 2020-12-12 ENCOUNTER — Other Ambulatory Visit: Payer: Medicaid Other

## 2020-12-12 ENCOUNTER — Encounter: Payer: Medicaid Other | Admitting: Women's Health

## 2020-12-30 ENCOUNTER — Ambulatory Visit (INDEPENDENT_AMBULATORY_CARE_PROVIDER_SITE_OTHER): Payer: Medicaid Other | Admitting: Women's Health

## 2020-12-30 ENCOUNTER — Other Ambulatory Visit: Payer: Self-pay

## 2020-12-30 ENCOUNTER — Encounter: Payer: Self-pay | Admitting: Women's Health

## 2020-12-30 DIAGNOSIS — Z8759 Personal history of other complications of pregnancy, childbirth and the puerperium: Secondary | ICD-10-CM

## 2020-12-30 DIAGNOSIS — Z30013 Encounter for initial prescription of injectable contraceptive: Secondary | ICD-10-CM | POA: Diagnosis not present

## 2020-12-30 MED ORDER — MEDROXYPROGESTERONE ACETATE 150 MG/ML IM SUSP: 150.0000 mg | INTRAMUSCULAR | 3 refills | Status: DC

## 2020-12-30 NOTE — Progress Notes (Signed)
POSTPARTUM VISIT Patient name: Brittany Mayo MRN [id number]  Date of birth: [birth year] Chief Complaint:   Postpartum Care  History of Present Illness:   Brittany Mayo is a 25 y.o. G57P1001 African American female being seen today for a postpartum visit. She is 5 weeks postpartum following a spontaneous vaginal delivery at 37.0 gestational weeks. IOL: yes, for gestational hypertension . Anesthesia: epidural.  Laceration: 2nd degree and labial.  Complications: oozing from lac, TXA given and vagina packed (EBL 249m). Inpatient contraception: yes depo given 7/6 .   [facility] . Tobacco use: no. Substance use disorder: no. Last pap smear: 06/24/20 and results were NILM w/ HRHPV not done. Next pap smear due: 2025 No LMP recorded.  Postpartum course has been complicated by PPHTN, d/c'd on nifedipine, stopped 2d ago as directed . Bleeding none. Bowel function is normal. Bladder function is normal. Urinary incontinence? no, fecal incontinence? no Patient is not sexually active. Last sexual activity: prior to birth of baby. Desired contraception: PP Depo given. Patient does not know if wants a pregnancy in the future.  Desired family size is uncertain #of children.   Upstream - 12/30/20 1133       Pregnancy Intention Screening   Does the patient want to become pregnant in the next year? No    Does the patient's partner want to become pregnant in the next year? No    Would the patient like to discuss contraceptive options today? No      Contraception Wrap Up   Current Method Hormonal Injection    End Method Hormonal Injection    Contraception Counseling Provided No            The pregnancy intention screening data noted above was reviewed. Potential methods of contraception were discussed. The patient elected to proceed with Hormonal Injection.  Edinburgh Postpartum Depression Screening: negative  Edinburgh Postnatal Depression Scale - 12/30/20 1134        Edinburgh Postnatal Depression Scale:  In the Past 7 Days   I have been able to laugh and see the funny side of things. 0    I have looked forward with enjoyment to things. 0    I have blamed myself unnecessarily when things went wrong. 0    I have been anxious or worried for no good reason. 0    I have felt scared or panicky for no good reason. 0    Things have been getting on top of me. 0    I have been so unhappy that I have had difficulty sleeping. 0    I have felt sad or miserable. 0    I have been so unhappy that I have been crying. 0    The thought of harming myself has occurred to me. 0    Edinburgh Postnatal Depression Scale Total 0             GAD 7 : Generalized Anxiety Score 09/11/2020 06/03/2020 02/21/2020  Nervous, Anxious, on Edge 0 0 0  Control/stop worrying 0 0 0  Worry too much - different things 0 0 0  Trouble relaxing 0 0 0  Restless 0 0 0  Easily annoyed or irritable 0 0 1  Afraid - awful might happen 0 0 0  Total GAD 7 Score 0 0 1  Anxiety Difficulty - - Not difficult at all     Baby's course has been uncomplicated. Baby is feeding by bottle. Infant has a  pediatrician/family doctor? Yes.  Childcare strategy if returning to work/school: family.  Pt has material needs met for her and baby: Yes.   Review of Systems:   Pertinent items are noted in HPI Denies Abnormal vaginal discharge w/ itching/odor/irritation, headaches, visual changes, shortness of breath, chest pain, abdominal pain, severe nausea/vomiting, or problems with urination or bowel movements. Pertinent History Reviewed:  Reviewed past medical,surgical, obstetrical and family history.  Reviewed problem list, medications and allergies. OB History  Gravida Para Term Preterm AB Living  '1 1 1 ' 0 0 1  SAB IAB Ectopic Multiple Live Births  0 0 0 0 1    # Outcome Date GA Lbr Len/2nd Weight Sex Delivery Anes PTL Lv  1 Term 11/24/20 [id number]:10 / 00:27 5 lb 5.9 oz (2.435 kg) F Vag-Spont EPI  LIV    Physical Assessment:   Vitals:   12/30/20 1124  BP: 135/76  Pulse: 82  Weight: 182 lb 6.4 oz (82.7 kg)  Height: '5\' 4"'  (1.626 m)  Body mass index is 31.31 kg/m.       Physical Examination:   General appearance: alert, well appearing, and in no distress  Mental status: alert, oriented to person, place, and time  Skin: warm & dry   Cardiovascular: normal heart rate noted   Respiratory: normal respiratory effort, no distress   Breasts: deferred, no complaints   Abdomen: soft, non-tender   Pelvic: lacs well healed. Thin prep pap obtained: No  Rectal: not examined  Extremities: Edema: none   Chaperone: Peggy Dones         No results found for this or any previous visit (from the past 24 hour(s)).  Assessment & Plan:  1) Postpartum exam 2) 5 wks s/p spontaneous vaginal delivery after IOL for GHTN 3) bottle feeding 4) Depression screening 5) Contraception management: rx depo, schedule next injection 6) Resolved PPHTN> stay off nifedipine, check home bp's BID x 1wk- if >140/90 let u[place]ore[place]now, if <140/90 can stop checking  Essential components of care per ACOG recommendations:  1.  Mood and well being:  If positive depression screen, discussed and plan developed.  If using tobacco we discussed reduction/cessation and risk of relapse If current substance abuse, we discussed and referral to local resources was offered.   2. Infant care and feeding:  If breastfeeding, discussed returning to work, pumping, breastfeeding-associated pain, guidance regarding return to fertility while lactating if not using another method. If needed, patient was provided with a letter to be allowed to pump q 2-3hrs to support lactation in a private location with access to a refrigerator to store breastmilk.   Recommended that all caregivers be immunized for flu, pertussis and other preventable communicable diseases If pt does not have material needs met for her/baby, referred to local resources for help  obtaining these.  3. Sexuality, contraception and birth spacing Provided guidance regarding sexuality, management of dyspareunia, and resumption of intercourse Discussed avoiding interpregnancy interval <669ms and recommended birth spacing of 18 months  4. Sleep and fatigue Discussed coping options for fatigue and sleep disruption Encouraged family/partner/community support of 4 hrs of uninterrupted sleep to help with mood and fatigue  5. Physical recovery  If pt had a C/S, assessed incisional pain and providing guidance on normal vs prolonged recovery If pt had a laceration, perineal healing and pain reviewed.  If urinary or fecal incontinence, discussed management and referred to PT or uro/gyn if indicated  Patient is safe to resume physical activity. Discussed attainment of healthy  weight.  6.  Chronic disease management Discussed pregnancy complications if any, and their implications for future childbearing and long-term maternal health. Review recommendations for prevention of recurrent pregnancy complications, such as 17 hydroxyprogesterone caproate to reduce risk for recurrent PTB not applicable, or aspirin to reduce risk of preeclampsia yes. Pt had GDM: no. If yes, 2hr GTT scheduled: not applicable. Reviewed medications and non-pregnant dosing including consideration of whether pt is breastfeeding using a reliable resource such as LactMed: not applicable Referred for f/u w/ PCP or subspecialist providers as indicated: not applicable  7. Health maintenance Mammogram at 24yo or earlier if indicated Pap smears as indicated  Meds:  Meds ordered this encounter  Medications   medroxyPROGESTERone (DEPO-PROVERA) 150 MG/ML injection    Sig: Inject 1 mL (150 mg total) into the muscle every 3 (three) months.    Dispense:  1 mL    Refill:  3    Order Specific Question:   Supervising Provider    Answer:   Florian Buff [[id number]]    Follow-up: Return for schedule next depo (last dose  7/6); then physical 87yr   No orders of the defined types were placed in this encounter.   [facility]8/01/2021 11:58 AM

## 2021-02-16 ENCOUNTER — Ambulatory Visit (INDEPENDENT_AMBULATORY_CARE_PROVIDER_SITE_OTHER): Payer: Medicaid Other

## 2021-02-16 ENCOUNTER — Other Ambulatory Visit: Payer: Self-pay

## 2021-02-16 DIAGNOSIS — Z3042 Encounter for surveillance of injectable contraceptive: Secondary | ICD-10-CM

## 2021-02-16 MED ORDER — MEDROXYPROGESTERONE ACETATE 150 MG/ML IM SUSY: PREFILLED_SYRINGE | Freq: Once | INTRAMUSCULAR | Status: AC

## 2021-02-16 NOTE — Progress Notes (Signed)
   NURSE VISIT- INJECTION  SUBJECTIVE:  Brittany Mayo is a 25 y.o. G59P1001 female here for a Depo Provera for contraception/period management. She is postpartum.   OBJECTIVE:  There were no vitals taken for this visit.  Appears well, in no apparent distress  Injection administered in: Left deltoid  Meds ordered this encounter  Medications   medroxyPROGESTERone Acetate SUSY    ASSESSMENT: Postpartum Depo Provera for contraception/period management PLAN: Follow-up: in 11-13 weeks for next Depo   Eusebia Grulke A Io Dieujuste  02/16/2021 10:35 AM

## 2021-05-11 ENCOUNTER — Other Ambulatory Visit: Payer: Self-pay

## 2021-05-11 ENCOUNTER — Ambulatory Visit (INDEPENDENT_AMBULATORY_CARE_PROVIDER_SITE_OTHER): Payer: Medicaid Other | Admitting: *Deleted

## 2021-05-11 DIAGNOSIS — Z3042 Encounter for surveillance of injectable contraceptive: Secondary | ICD-10-CM

## 2021-05-11 MED ORDER — MEDROXYPROGESTERONE ACETATE 150 MG/ML IM SUSP
150.0000 mg | Freq: Once | INTRAMUSCULAR | Status: AC
  Administered 2021-05-11: 10:00:00 150 mg via INTRAMUSCULAR

## 2021-05-11 NOTE — Progress Notes (Signed)
° °  NURSE VISIT- INJECTION  SUBJECTIVE:  Brittany Mayo is a 25 y.o. G43P1001 female here for a Depo Provera for contraception/period management. She is a GYN patient.   OBJECTIVE:  There were no vitals taken for this visit.  Appears well, in no apparent distress  Injection administered in: Right deltoid  Meds ordered this encounter  Medications   medroxyPROGESTERone (DEPO-PROVERA) injection 150 mg    ASSESSMENT: GYN patient Depo Provera for contraception/period management PLAN: Follow-up: in 11-13 weeks for next Depo.   Malachy Mood  05/11/2021 10:31 AM

## 2021-07-31 ENCOUNTER — Other Ambulatory Visit: Payer: Self-pay

## 2021-07-31 ENCOUNTER — Ambulatory Visit (INDEPENDENT_AMBULATORY_CARE_PROVIDER_SITE_OTHER): Payer: Medicaid Other

## 2021-07-31 DIAGNOSIS — Z3042 Encounter for surveillance of injectable contraceptive: Secondary | ICD-10-CM

## 2021-07-31 MED ORDER — MEDROXYPROGESTERONE ACETATE 150 MG/ML IM SUSY: PREFILLED_SYRINGE | Freq: Once | INTRAMUSCULAR | Status: AC

## 2021-07-31 NOTE — Progress Notes (Signed)
? ?  NURSE VISIT- INJECTION ? ?SUBJECTIVE:  ?Brittany Mayo is a 26 y.o. G67P1001 female here for a Depo Provera for contraception/period management. She is a GYN patient.  ? ?OBJECTIVE:  ?There were no vitals taken for this visit.  ?Appears well, in no apparent distress ? ?Injection administered in: Left deltoid ? ?Meds ordered this encounter  ?Medications  ? medroxyPROGESTERone Acetate SUSY  ? ? ?ASSESSMENT: ?GYN patient Depo Provera for contraception/period management ?PLAN: ?Follow-up: in 11-13 weeks for next Depo  ? ?Lakaya Tolen A Elliot Simoneaux  ?07/31/2021 ?10:21 AM ? ?

## 2021-10-23 ENCOUNTER — Ambulatory Visit (INDEPENDENT_AMBULATORY_CARE_PROVIDER_SITE_OTHER): Payer: Medicaid Other | Admitting: *Deleted

## 2021-10-23 DIAGNOSIS — Z3042 Encounter for surveillance of injectable contraceptive: Secondary | ICD-10-CM | POA: Diagnosis not present

## 2021-10-23 MED ORDER — MEDROXYPROGESTERONE ACETATE 150 MG/ML IM SUSP
150.0000 mg | Freq: Once | INTRAMUSCULAR | Status: AC
  Administered 2021-10-23: 150 mg via INTRAMUSCULAR

## 2021-10-23 NOTE — Progress Notes (Signed)
   NURSE VISIT- INJECTION  SUBJECTIVE:  Brittany Mayo is a 26 y.o. G22P1001 female here for a Depo Provera for contraception/period management. She is a GYN patient.   OBJECTIVE:  There were no vitals taken for this visit.  Appears well, in no apparent distress  Injection administered in: Right deltoid  Meds ordered this encounter  Medications   medroxyPROGESTERone (DEPO-PROVERA) injection 150 mg    ASSESSMENT: GYN patient Depo Provera for contraception/period management PLAN: Follow-up: in 11-13 weeks for next Depo   Malachy Mood  10/23/2021 12:54 PM

## 2022-01-12 ENCOUNTER — Other Ambulatory Visit: Payer: Self-pay | Admitting: Women's Health

## 2022-01-13 ENCOUNTER — Ambulatory Visit (INDEPENDENT_AMBULATORY_CARE_PROVIDER_SITE_OTHER): Payer: Medicaid Other | Admitting: Adult Health

## 2022-01-13 ENCOUNTER — Encounter: Payer: Self-pay | Admitting: Adult Health

## 2022-01-13 ENCOUNTER — Other Ambulatory Visit (HOSPITAL_COMMUNITY)
Admission: RE | Admit: 2022-01-13 | Discharge: 2022-01-13 | Disposition: A | Discharge status: Home / Self Care | Payer: Medicaid Other | Source: Ambulatory Visit | Attending: Adult Health | Admitting: Adult Health

## 2022-01-13 VITALS — BP 132/80 | HR 91 | Ht 64.0 in | Wt 194.0 lb

## 2022-01-13 DIAGNOSIS — Z113 Encounter for screening for infections with a predominantly sexual mode of transmission: Secondary | ICD-10-CM | POA: Insufficient documentation

## 2022-01-13 DIAGNOSIS — Z3042 Encounter for surveillance of injectable contraceptive: Secondary | ICD-10-CM | POA: Insufficient documentation

## 2022-01-13 DIAGNOSIS — Z01419 Encounter for gynecological examination (general) (routine) without abnormal findings: Secondary | ICD-10-CM

## 2022-01-13 DIAGNOSIS — Z3009 Encounter for other general counseling and advice on contraception: Secondary | ICD-10-CM

## 2022-01-13 MED ORDER — MEDROXYPROGESTERONE ACETATE 150 MG/ML IM SUSY: PREFILLED_SYRINGE | INTRAMUSCULAR | 4 refills | Status: DC

## 2022-01-13 NOTE — Progress Notes (Signed)
Patient ID: Brittany Mayo, female   DOB: 1995-11-02, 26 y.o.   MRN: [id number] History of Present Illness: Brittany Mayo is a 26 year old black female,single, G1P1001, in for a well woman gyn exam. She has Family Planning Medicaid.  She forgot to bring depo, will come back on Monday.  Pap was 06/24/20 Satisfactory for evaluation; transformation zone component PRESENT.    Diagnosis - Negative for intraepithelial lesion or malignancy (NILM)     Current Medications, Allergies, Past Medical History, Past Surgical History, Family History and Social History were reviewed in [employer] record.     Review of Systems: Patient denies any headaches, hearing loss, fatigue, blurred vision, shortness of breath, chest pain, abdominal pain, problems with bowel movements, urination, or intercourse. No joint pain or mood swings.     Physical Exam:BP 132/80 (BP Location: Left Arm, Patient Position: Sitting, Cuff Size: Normal)   Pulse 91   Ht 5\' 4"  (1.626 m)   Wt 194 lb (88 kg)   Breastfeeding No   BMI 33.30 kg/m   General:  Well developed, well nourished, no acute distress Skin:  Warm and dry Neck:  Midline trachea, normal thyroid, good ROM, no lymphadenopathy Lungs; Clear to auscultation bilaterally Breast:  No dominant palpable mass, retraction, or nipple discharge Cardiovascular: Regular rate and rhythm Abdomen:  Soft, non tender, no hepatosplenomegaly Pelvic:  External genitalia is normal in appearance, no lesions.  The vagina is normal in appearance. Urethra has no lesions or masses. The cervix is bulbous,dark blood at os. CV swab obtained. Uterus is felt to be normal size, shape, and contour.  No adnexal masses or tenderness noted.Bladder is non tender, no masses felt. Extremities/musculoskeletal:  No swelling or varicosities noted, no clubbing or cyanosis Psych:  No mood changes, alert and cooperative,seems happy AA is 0 Fall risk is low    01/13/2022    3:38 PM 09/11/2020     9:03 AM 06/03/2020    9:56 AM  Depression screen PHQ 2/9  Decreased Interest 0 0 1  Down, Depressed, Hopeless 0 0 0  PHQ - 2 Score 0 0 1  Altered sleeping 0 0 0  Tired, decreased energy 0 0 1  Change in appetite 0 0 0  Feeling bad or failure about yourself  0 0 0  Trouble concentrating 0 0 0  Moving slowly or fidgety/restless 0 0 0  Suicidal thoughts 0 0 0  PHQ-9 Score 0 0 2       01/13/2022    3:38 PM 09/11/2020    9:03 AM 06/03/2020    9:56 AM 02/21/2020   12:00 PM  GAD 7 : Generalized Anxiety Score  Nervous, Anxious, on Edge 0 0 0 0  Control/stop worrying 0 0 0 0  Worry too much - different things 0 0 0 0  Trouble relaxing 0 0 0 0  Restless 0 0 0 0  Easily annoyed or irritable 0 0 0 1  Afraid - awful might happen 0 0 0 0  Total GAD 7 Score 0 0 0 1  Anxiety Difficulty    Not difficult at all      Upstream - 01/13/22 1542       Pregnancy Intention Screening   Does the patient want to become pregnant in the next year? No    Does the patient's partner want to become pregnant in the next year? No    Would the patient like to discuss contraceptive options today? No  Contraception Wrap Up   Current Method Hormonal Injection    End Method Hormonal Injection             Examination chaperoned by Malachy Mood LPN  Impression and Plan: 1. Encounter for well woman exam with routine gynecological exam Pap in 2025 Physical in 1 years  2. Family planning She is on depo  3. Screening examination for STD (sexually transmitted disease) CV swab sent for GC/CHL and trich  Will check labs  - Cervicovaginal ancillary only( [facility]) - Hepatitis C antibody - Hepatitis B surface antigen - HIV Antibody (routine testing w rflx) - RPR  4. Encounter for surveillance of injectable contraceptive Refilled depo Meds ordered this encounter  Medications   medroxyPROGESTERone Acetate 150 MG/ML SUSY    Sig: INJECT 1 VIAL INTRAMUSCULARLY EVERY 3 MONTHS IN OFFICE.     Dispense:  1 mL    Refill:  4    Order Specific Question:   Supervising Provider    Answer:   Lazaro Arms [[id number]]   Return 01/18/22 for depo

## 2022-01-14 LAB — HEPATITIS C ANTIBODY: Hep C Virus Ab: NONREACTIVE

## 2022-01-14 LAB — RPR: RPR Ser Ql: NONREACTIVE

## 2022-01-14 LAB — HEPATITIS B SURFACE ANTIGEN: Hepatitis B Surface Ag: NEGATIVE

## 2022-01-14 LAB — HIV ANTIBODY (ROUTINE TESTING W REFLEX): HIV Screen 4th Generation wRfx: NONREACTIVE

## 2022-01-15 LAB — CERVICOVAGINAL ANCILLARY ONLY
Chlamydia: NEGATIVE
Comment: NEGATIVE
Comment: NEGATIVE
Comment: NORMAL
Neisseria Gonorrhea: NEGATIVE
Trichomonas: NEGATIVE

## 2022-01-18 ENCOUNTER — Ambulatory Visit (INDEPENDENT_AMBULATORY_CARE_PROVIDER_SITE_OTHER): Payer: Medicaid Other | Admitting: *Deleted

## 2022-01-18 DIAGNOSIS — Z3042 Encounter for surveillance of injectable contraceptive: Secondary | ICD-10-CM | POA: Diagnosis not present

## 2022-01-18 MED ORDER — MEDROXYPROGESTERONE ACETATE 150 MG/ML IM SUSP
150.0000 mg | Freq: Once | INTRAMUSCULAR | Status: AC
  Administered 2022-01-18: 150 mg via INTRAMUSCULAR

## 2022-01-18 NOTE — Progress Notes (Signed)
   NURSE VISIT- INJECTION  SUBJECTIVE:  Brittany Mayo is a 26 y.o. G81P1001 female here for a Depo Provera for contraception/period management. She is a GYN patient.   OBJECTIVE:  There were no vitals taken for this visit.  Appears well, in no apparent distress  Injection administered in: Left deltoid  Meds ordered this encounter  Medications   medroxyPROGESTERone (DEPO-PROVERA) injection 150 mg    ASSESSMENT: GYN patient Depo Provera for contraception/period management PLAN: Follow-up: in 11-13 weeks for next Depo   Jobe Marker  01/18/2022 3:38 PM

## 2022-04-06 ENCOUNTER — Ambulatory Visit (INDEPENDENT_AMBULATORY_CARE_PROVIDER_SITE_OTHER): Payer: Medicaid Other | Admitting: *Deleted

## 2022-04-06 DIAGNOSIS — Z3042 Encounter for surveillance of injectable contraceptive: Secondary | ICD-10-CM | POA: Diagnosis not present

## 2022-04-06 MED ORDER — MEDROXYPROGESTERONE ACETATE 150 MG/ML IM SUSP
150.0000 mg | Freq: Once | INTRAMUSCULAR | Status: AC
  Administered 2022-04-06: 150 mg via INTRAMUSCULAR

## 2022-04-06 NOTE — Progress Notes (Signed)
   NURSE VISIT- INJECTION  SUBJECTIVE:  Brittany Mayo is a 26 y.o. G37P1001 female here for a Depo Provera for contraception/period management. She is a GYN patient.   OBJECTIVE:  There were no vitals taken for this visit.  Appears well, in no apparent distress  Injection administered in: Right deltoid  Meds ordered this encounter  Medications   medroxyPROGESTERone (DEPO-PROVERA) injection 150 mg    ASSESSMENT: GYN patient Depo Provera for contraception/period management PLAN: Follow-up: in 11-13 weeks for next Depo   Malachy Mood  04/06/2022 3:59 PM

## 2022-05-30 IMAGING — US US MFM FETAL BPP W/O NON-STRESS
1 series · 15 of 28 positions shown · non-contrast
Comparison: none

[Series 1: us mfm fetal bpp w/o non-stress · 30 acquisitions, 15 frames shown]
[im 1/30]
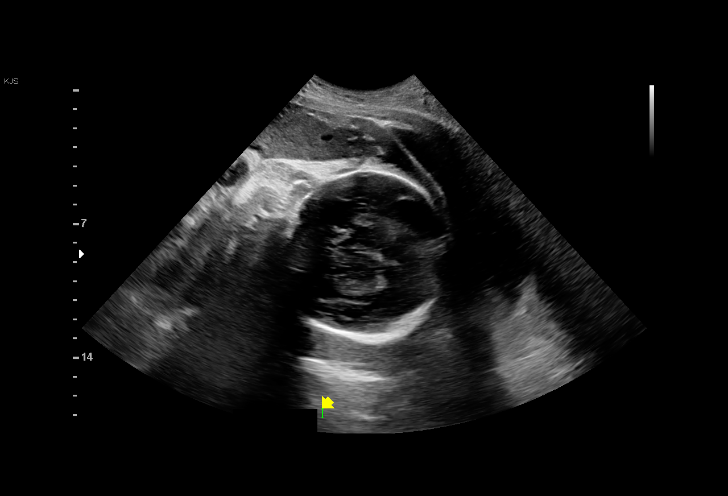
[im 3/30]
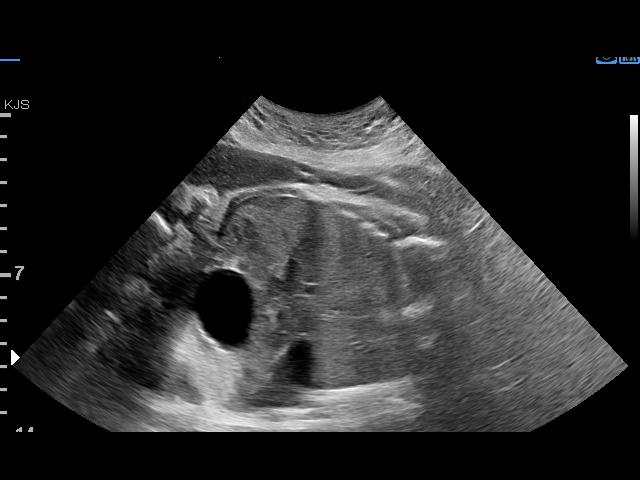
[im 5/30]
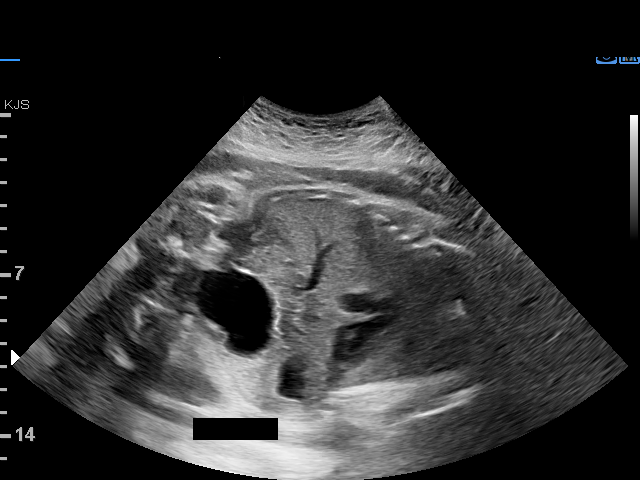
[im 7/30]
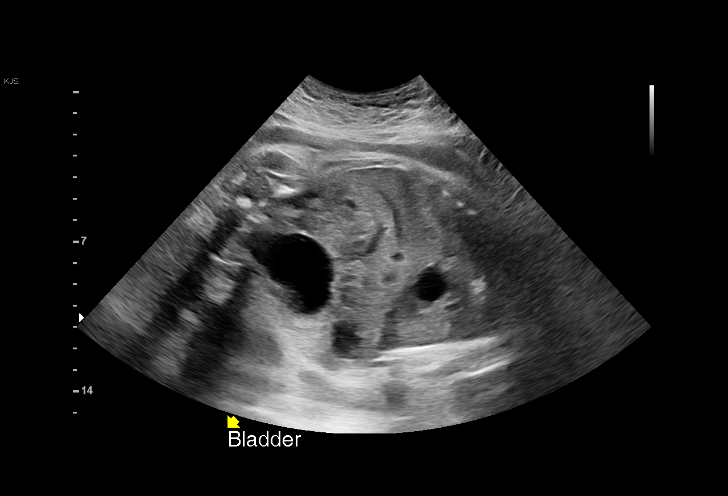
[im 9/30]
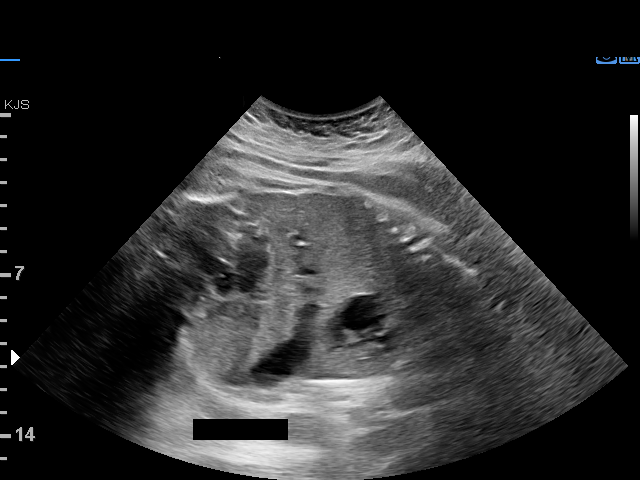
[im 11/30]
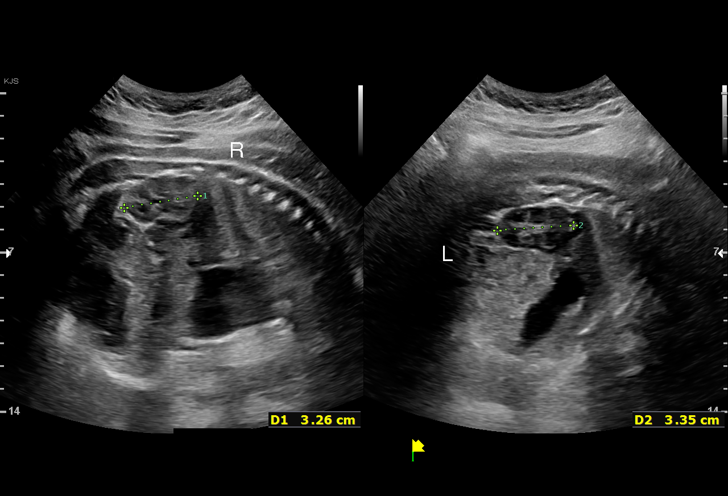
[im 13/30]
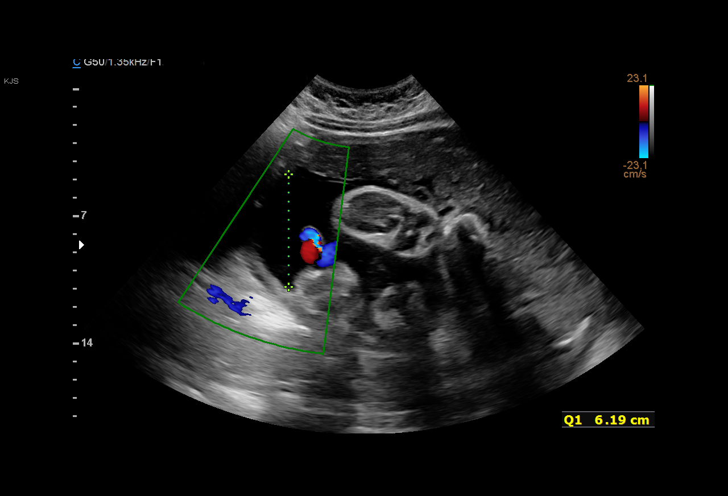
[im 16/30]
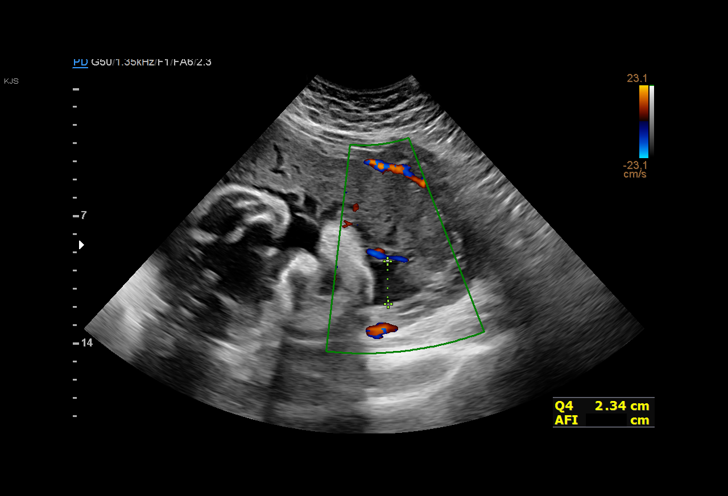
[im 17/30]
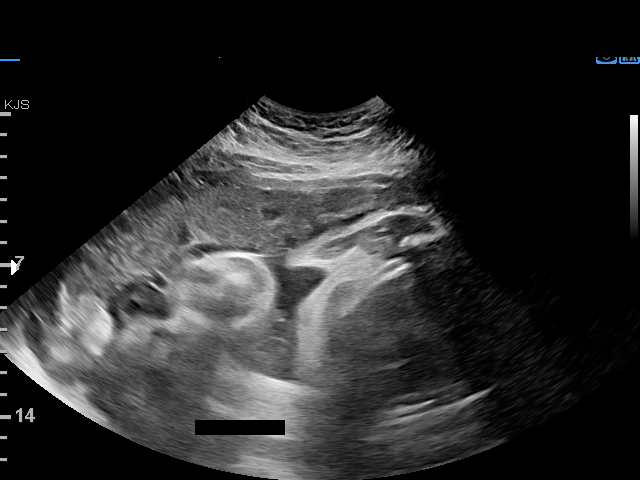
[im 19/30]
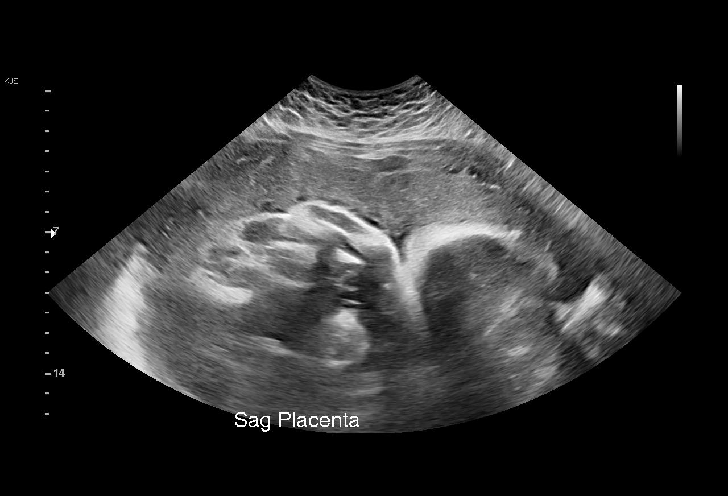
[im 21/30]
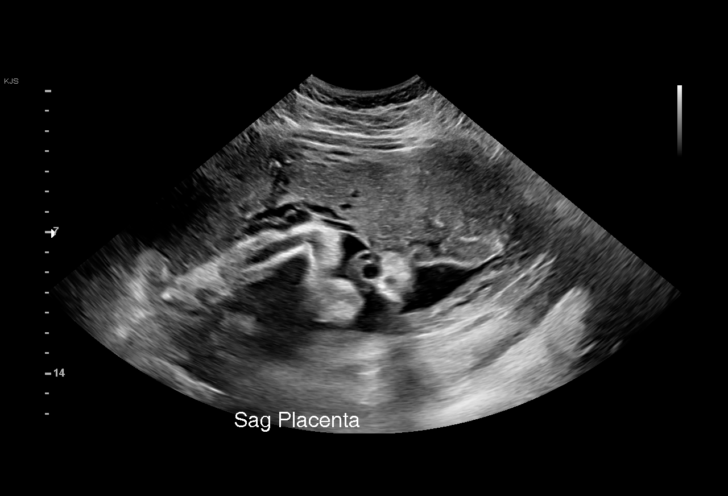
[im 23/30]
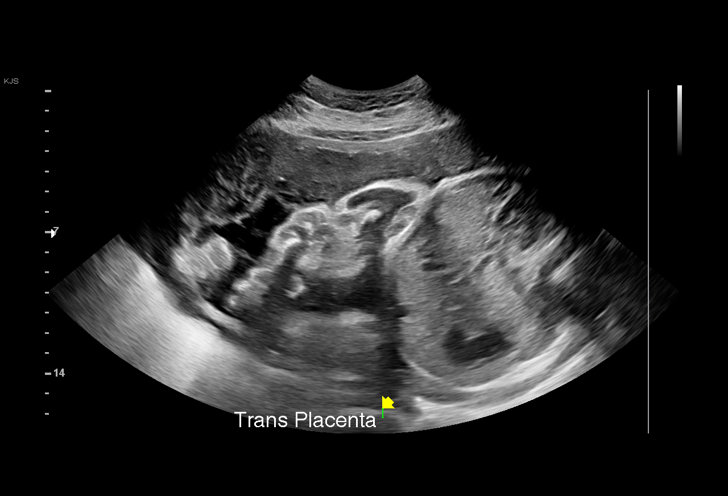
[im 25/30]
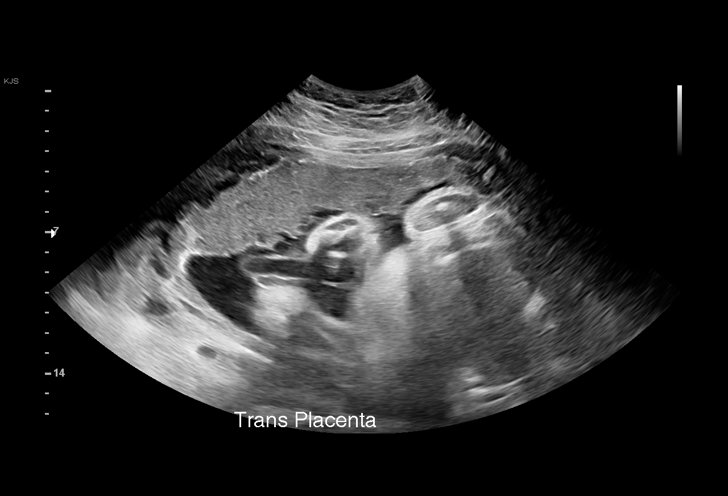
[im 27/30]
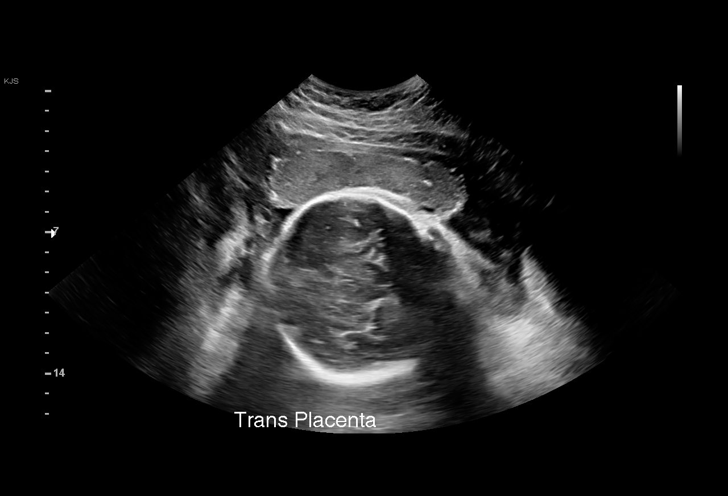
[im 30/30]
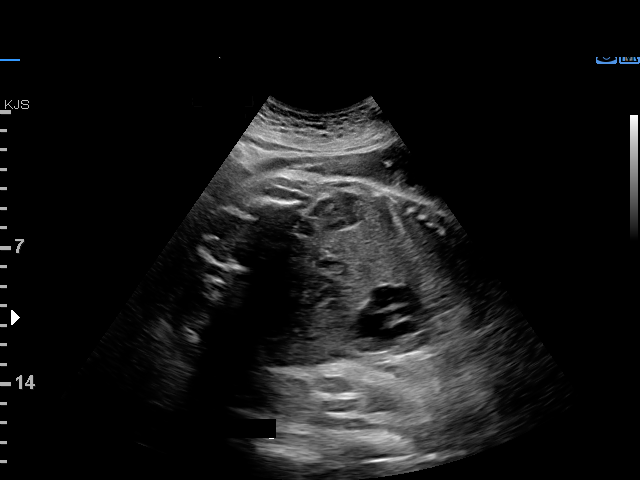

[15 of 28 positions shown; findings below may reference images not displayed]

Indications

 35 weeks gestation of pregnancy
 Abnormal finding on antenatal screening
Fetal Evaluation

 Num Of Fetuses:          1
 Fetal Heart Rate(bpm):   155
 Cardiac Activity:        Observed
 Presentation:            Cephalic
 Placenta:                Anterior
 P. Cord Insertion:       Not well visualized

 Amniotic Fluid
 AFI FV:      Within normal limits

 AFI Sum(cm)     %Tile       Largest Pocket(cm)
 13.73           49

 RUQ(cm)       RLQ(cm)       LUQ(cm)        LLQ(cm)

Biophysical Evaluation

 Amniotic F.V:   Pocket => 2 cm             F. Tone:         Observed
 F. Movement:    Observed                   Score:           [DATE]
 F. Breathing:   Observed
Gestational Age

 LMP:           36w 5d        Date:  03/02/20                 EDD:   12/07/20
 Clinical EDD:  35w 4d                                        EDD:   12/15/20
 Best:          35w 4d     Det. By:  Clinical EDD             EDD:   12/15/20
Anatomy

 Diaphragm:             Appears normal         Kidneys:                Appear normal
 Stomach:               Appears normal, left   Bladder:                Appears normal
                        sided
Cervix Uterus Adnexa

 Cervix
 Not visualized (advanced GA >04wks)

 Uterus
 No abnormality visualized.

 Right Ovary
 Not visualized.

 Left Ovary
 Not visualized.

 Cul De Sac
 No free fluid seen.

 Adnexa
 No adnexal mass visualized.
Impression

 Antenatal testing performed
 The biophysical profile was [DATE] with good fetal movement and
 amniotic fluid volume.
Recommendations

 Clinical correlation recommended

## 2022-06-29 ENCOUNTER — Ambulatory Visit (INDEPENDENT_AMBULATORY_CARE_PROVIDER_SITE_OTHER): Payer: Medicaid Other | Admitting: *Deleted

## 2022-06-29 DIAGNOSIS — Z3042 Encounter for surveillance of injectable contraceptive: Secondary | ICD-10-CM | POA: Diagnosis not present

## 2022-06-29 MED ORDER — MEDROXYPROGESTERONE ACETATE 150 MG/ML IM SUSP
150.0000 mg | Freq: Once | INTRAMUSCULAR | Status: AC
  Administered 2022-06-29: 150 mg via INTRAMUSCULAR

## 2022-06-29 NOTE — Progress Notes (Signed)
   NURSE VISIT- INJECTION  SUBJECTIVE:  Brittany Mayo is a 27 y.o. G3P1001 female here for a Depo Provera for contraception/period management. She is a GYN patient.   OBJECTIVE:  There were no vitals taken for this visit.  Appears well, in no apparent distress  Injection administered in: Right deltoid  Meds ordered this encounter  Medications   medroxyPROGESTERone (DEPO-PROVERA) injection 150 mg    ASSESSMENT: GYN patient Depo Provera for contraception/period management PLAN: Follow-up: in 11-13 weeks for next Depo   [name]  06/29/2022 4:02 PM

## 2022-09-21 ENCOUNTER — Ambulatory Visit (INDEPENDENT_AMBULATORY_CARE_PROVIDER_SITE_OTHER): Payer: Medicaid Other | Admitting: *Deleted

## 2022-09-21 DIAGNOSIS — Z3042 Encounter for surveillance of injectable contraceptive: Secondary | ICD-10-CM

## 2022-09-21 MED ORDER — MEDROXYPROGESTERONE ACETATE 150 MG/ML IM SUSY
150.0000 mg | PREFILLED_SYRINGE | Freq: Once | INTRAMUSCULAR | Status: AC
  Administered 2022-09-21: 150 mg via INTRAMUSCULAR

## 2022-09-21 NOTE — Progress Notes (Signed)
   NURSE VISIT- INJECTION  SUBJECTIVE:  Brittany Mayo is a 27 y.o. G32P1001 female here for a Depo Provera for contraception/period management. She is a GYN patient.   OBJECTIVE:  There were no vitals taken for this visit.  Appears well, in no apparent distress  Injection administered in: Left deltoid  Meds ordered this encounter  Medications   medroxyPROGESTERone Acetate SUSY 150 mg    ASSESSMENT: GYN patient Depo Provera for contraception/period management PLAN: Follow-up: in 11-13 weeks for next Depo   Jobe Marker  09/21/2022 3:30 PM

## 2022-12-14 ENCOUNTER — Ambulatory Visit (INDEPENDENT_AMBULATORY_CARE_PROVIDER_SITE_OTHER): Payer: Medicaid Other | Admitting: *Deleted

## 2022-12-14 DIAGNOSIS — Z3042 Encounter for surveillance of injectable contraceptive: Secondary | ICD-10-CM

## 2022-12-14 MED ORDER — MEDROXYPROGESTERONE ACETATE 150 MG/ML IM SUSY
150.0000 mg | PREFILLED_SYRINGE | Freq: Once | INTRAMUSCULAR | Status: AC
  Administered 2022-12-14: 150 mg via INTRAMUSCULAR

## 2022-12-14 NOTE — Progress Notes (Signed)
   NURSE VISIT- INJECTION  SUBJECTIVE:  Brittany Mayo is a 27 y.o. G1P1001 female here for a Depo Provera for contraception/period management. She is a GYN patient.   OBJECTIVE:  There were no vitals taken for this visit.  Appears well, in no apparent distress  Injection administered in: right deltoid  No orders of the defined types were placed in this encounter.   ASSESSMENT: GYN patient Depo Provera for contraception/period management PLAN: Follow-up: in 11-13 weeks for next Depo   Annamarie Dawley  12/14/2022 10:00 AM

## 2023-03-05 ENCOUNTER — Other Ambulatory Visit: Payer: Self-pay | Admitting: Adult Health

## 2023-03-08 ENCOUNTER — Ambulatory Visit: Payer: Medicaid Other

## 2023-03-08 DIAGNOSIS — Z3042 Encounter for surveillance of injectable contraceptive: Secondary | ICD-10-CM

## 2023-03-08 MED ORDER — MEDROXYPROGESTERONE ACETATE 150 MG/ML IM SUSY
150.0000 mg | PREFILLED_SYRINGE | Freq: Once | INTRAMUSCULAR | Status: AC
  Administered 2023-03-08: 150 mg via INTRAMUSCULAR

## 2023-03-08 NOTE — Progress Notes (Signed)
   NURSE VISIT- INJECTION  SUBJECTIVE:  Brittany Mayo is a 27 y.o. G22P1001 female here for a Depo Provera for contraception/period management. She is a GYN patient.   OBJECTIVE:  There were no vitals taken for this visit.  Appears well, in no apparent distress  Injection administered in: Right vastus lateralis  Meds ordered this encounter  Medications   medroxyPROGESTERone Acetate SUSY 150 mg    ASSESSMENT: GYN patient Depo Provera for contraception/period management PLAN: Follow-up: in 11-13 weeks for next Depo   Caralyn Guile  03/08/2023 10:41 AM

## 2023-05-31 ENCOUNTER — Ambulatory Visit: Payer: Medicaid Other | Admitting: *Deleted

## 2023-05-31 DIAGNOSIS — Z3042 Encounter for surveillance of injectable contraceptive: Secondary | ICD-10-CM | POA: Diagnosis not present

## 2023-05-31 MED ORDER — MEDROXYPROGESTERONE ACETATE 150 MG/ML IM SUSY
150.0000 mg | PREFILLED_SYRINGE | Freq: Once | INTRAMUSCULAR | Status: AC
  Administered 2023-05-31: 150 mg via INTRAMUSCULAR

## 2023-05-31 NOTE — Progress Notes (Signed)
   NURSE VISIT- INJECTION  SUBJECTIVE:  Brittany Mayo is a 28 y.o. G56P1001 female here for a Depo Provera  for contraception/period management. She is a GYN patient.   OBJECTIVE:  There were no vitals taken for this visit.  Appears well, in no apparent distress  Injection administered in: Right deltoid  Meds ordered this encounter  Medications   medroxyPROGESTERone  Acetate SUSY 150 mg    ASSESSMENT: GYN patient Depo Provera  for contraception/period management PLAN: Follow-up: in 11-13 weeks for next Depo   Rutherford Rover  05/31/2023 10:29 AM

## 2023-08-22 ENCOUNTER — Ambulatory Visit: Admitting: *Deleted

## 2023-08-22 DIAGNOSIS — Z3042 Encounter for surveillance of injectable contraceptive: Secondary | ICD-10-CM | POA: Diagnosis not present

## 2023-08-22 MED ORDER — MEDROXYPROGESTERONE ACETATE 150 MG/ML IM SUSY
150.0000 mg | PREFILLED_SYRINGE | Freq: Once | INTRAMUSCULAR | Status: AC
  Administered 2023-08-22: 150 mg via INTRAMUSCULAR

## 2023-08-22 NOTE — Progress Notes (Signed)
   NURSE VISIT- INJECTION  SUBJECTIVE:  Brittany Mayo is a 28 y.o. G47P1001 female here for a Depo Provera for contraception/period management. She is a GYN patient.   OBJECTIVE:  There were no vitals taken for this visit.  Appears well, in no apparent distress  Injection administered in: Left deltoid  Meds ordered this encounter  Medications   medroxyPROGESTERone Acetate SUSY 150 mg    ASSESSMENT: GYN patient Depo Provera for contraception/period management PLAN: Follow-up: in 11-13 weeks for next Depo   Jobe Marker  08/22/2023 9:46 AM

## 2023-08-23 ENCOUNTER — Ambulatory Visit: Payer: Medicaid Other

## 2023-11-14 ENCOUNTER — Ambulatory Visit

## 2023-11-14 ENCOUNTER — Ambulatory Visit
Admission: EM | Admit: 2023-11-14 | Discharge: 2023-11-14 | Disposition: A | Discharge status: Home / Self Care | Payer: Self-pay | Attending: Family Medicine | Admitting: Family Medicine

## 2023-11-14 DIAGNOSIS — J0300 Acute streptococcal tonsillitis, unspecified: Secondary | ICD-10-CM

## 2023-11-14 LAB — POCT RAPID STREP A (OFFICE): Rapid Strep A Screen: POSITIVE — AB

## 2023-11-14 MED ORDER — AMOXICILLIN 875 MG PO TABS: 875.0000 mg | ORAL_TABLET | Freq: Two times a day (BID) | ORAL | 0 refills | Status: AC

## 2023-11-14 NOTE — ED Triage Notes (Signed)
 Pt reports sore throat, exposed to strep throat x 2 days

## 2023-11-14 NOTE — ED Provider Notes (Signed)
 [facility]    CSN: [id number] Arrival date & time: 11/14/23  1709      History   Chief Complaint No chief complaint on file.   HPI Brittany Mayo is a 28 y.o. female.   Patient presenting today with 2-day history of sore throat.  Denies fever, chills, cough, congestion, chest pain, shortness of breath, abdominal pain, vomiting, diarrhea.  So far not trying anything over-the-counter for symptoms.  Daughter recently diagnosed with strep.    Past Medical History:  Diagnosis Date   Medical history non-contributory     Patient Active Problem List   Diagnosis Date Noted   Encounter for well woman exam with routine gynecological exam 01/13/2022   Family planning 01/13/2022   Screening examination for STD (sexually transmitted disease) 01/13/2022   Encounter for surveillance of injectable contraceptive 01/13/2022   History of gestational hypertension & PPHTN 11/06/2020    Past Surgical History:  Procedure Laterality Date   NO PAST SURGERIES      OB History     Gravida  1   Para  1   Term  1   Preterm  0   AB  0   Living  1      SAB  0   IAB  0   Ectopic  0   Multiple  0   Live Births  1            Home Medications    Prior to Admission medications   Medication Sig Start Date End Date Taking? Authorizing Provider  amoxicillin (AMOXIL) 875 MG tablet Take 1 tablet (875 mg total) by mouth 2 (two) times daily. 11/14/23  Yes Stuart Vernell Norris, PA-C  medroxyPROGESTERone  Acetate 150 MG/ML SUSY INJECT 1 VIAL INTRAMUSCULARLY EVERY 3 MONTHS IN OFFICE. 03/07/23   Signa Delon LABOR, NP    Family History Family History  Problem Relation Age of Onset   Healthy Father    Healthy Mother    Healthy Sister    Healthy Sister    Healthy Sister     Social History Social History   Tobacco Use   Smoking status: Never   Smokeless tobacco: Never  Vaping Use   Vaping status: Never Used  Substance Use Topics   Alcohol use: Not Currently    Drug use: No     Allergies   Patient has no known allergies.   Review of Systems Review of Systems Per HPI  Physical Exam Triage Vital Signs ED Triage Vitals  Encounter Vitals Group     BP 11/14/23 1807 131/84     Girls Systolic BP Percentile --      Girls Diastolic BP Percentile --      Boys Systolic BP Percentile --      Boys Diastolic BP Percentile --      Pulse Rate 11/14/23 1807 (!) 121     Resp 11/14/23 1807 16     Temp 11/14/23 1807 98.5 F (36.9 C)     Temp Source 11/14/23 1807 Oral     SpO2 11/14/23 1807 97 %     Weight --      Height --      Head Circumference --      Peak Flow --      Pain Score 11/14/23 1810 0     Pain Loc --      Pain Education --      Exclude from Growth Chart --    No data found.  Updated Vital Signs BP 131/84 (BP Location: Right Arm)   Pulse (!) 121   Temp 98.5 F (36.9 C) (Oral)   Resp 16   SpO2 97%   Visual Acuity Right Eye Distance:   Left Eye Distance:   Bilateral Distance:    Right Eye Near:   Left Eye Near:    Bilateral Near:     Physical Exam Vitals and nursing note reviewed.  Constitutional:      Appearance: Normal appearance. She is not ill-appearing.  HENT:     Head: Atraumatic.     Mouth/Throat:     Mouth: Mucous membranes are moist.     Pharynx: Oropharyngeal exudate and posterior oropharyngeal erythema present.   Eyes:     Extraocular Movements: Extraocular movements intact.     Conjunctiva/sclera: Conjunctivae normal.    Cardiovascular:     Rate and Rhythm: Normal rate and regular rhythm.     Heart sounds: Normal heart sounds.  Pulmonary:     Effort: Pulmonary effort is normal.     Breath sounds: Normal breath sounds.   Musculoskeletal:        General: Normal range of motion.     Cervical back: Normal range of motion and neck supple.  Lymphadenopathy:     Cervical: Cervical adenopathy present.   Skin:    General: Skin is warm and dry.   Neurological:     Mental Status: She is alert  and oriented to person, place, and time.   Psychiatric:        Mood and Affect: Mood normal.        Thought Content: Thought content normal.        Judgment: Judgment normal.      UC Treatments / Results  Labs (all labs ordered are listed, but only abnormal results are displayed) Labs Reviewed  POCT RAPID STREP A (OFFICE) - Abnormal; Notable for the following components:      Result Value   Rapid Strep A Screen Positive (*)    All other components within normal limits    EKG   Radiology No results found.  Procedures Procedures (including critical care time)  Medications Ordered in UC Medications - No data to display  Initial Impression / Assessment and Plan / UC Course  I have reviewed the triage vital signs and the nursing notes.  Pertinent labs & imaging results that were available during my care of the patient were reviewed by me and considered in my medical decision making (see chart for details).     Rapid strep positive, will treat with amoxicillin, supportive over-the-counter medications and home care.  Return for worsening symptoms.  Final Clinical Impressions(s) / UC Diagnoses   Final diagnoses:  Strep tonsillitis   Discharge Instructions   None    ED Prescriptions     Medication Sig Dispense Auth. Provider   amoxicillin (AMOXIL) 875 MG tablet Take 1 tablet (875 mg total) by mouth 2 (two) times daily. 20 tablet Stuart Vernell Norris, [place]      PDMP not reviewed this encounter.   Stuart Vernell [place], [place] 11/14/23 [phone]

## 2023-11-15 ENCOUNTER — Ambulatory Visit: Payer: Self-pay

## 2023-11-15 ENCOUNTER — Ambulatory Visit (INDEPENDENT_AMBULATORY_CARE_PROVIDER_SITE_OTHER)

## 2023-11-15 DIAGNOSIS — Z3042 Encounter for surveillance of injectable contraceptive: Secondary | ICD-10-CM | POA: Diagnosis not present

## 2023-11-15 MED ORDER — MEDROXYPROGESTERONE ACETATE 150 MG/ML IM SUSY
150.0000 mg | PREFILLED_SYRINGE | Freq: Once | INTRAMUSCULAR | Status: AC
  Administered 2023-11-15: 150 mg via INTRAMUSCULAR

## 2023-11-15 NOTE — Progress Notes (Signed)
   NURSE VISIT- INJECTION  SUBJECTIVE:  Brittany Mayo is a 28 y.o. G30P1001 female here for a Depo Provera  for contraception/period management. She is a GYN patient.   OBJECTIVE:  There were no vitals taken for this visit.  Appears well, in no apparent distress  Injection administered in: Right deltoid  Meds ordered this encounter  Medications   medroxyPROGESTERone  Acetate SUSY 150 mg    ASSESSMENT: GYN patient Depo Provera  for contraception/period management PLAN: Follow-up: in 11-13 weeks for next Depo   Aleck FORBES Blase  11/15/2023 3:59 PM

## 2024-02-07 ENCOUNTER — Ambulatory Visit (INDEPENDENT_AMBULATORY_CARE_PROVIDER_SITE_OTHER): Payer: Self-pay | Admitting: *Deleted

## 2024-02-07 DIAGNOSIS — Z3042 Encounter for surveillance of injectable contraceptive: Secondary | ICD-10-CM

## 2024-02-07 MED ORDER — MEDROXYPROGESTERONE ACETATE 150 MG/ML IM SUSY
150.0000 mg | PREFILLED_SYRINGE | Freq: Once | INTRAMUSCULAR | Status: AC
  Administered 2024-02-07: 150 mg via INTRAMUSCULAR

## 2024-02-07 NOTE — Progress Notes (Signed)
   NURSE VISIT- INJECTION  SUBJECTIVE:  Brittany Mayo is a 28 y.o. G62P1001 female here for a Depo Provera  for contraception/period management. She is a GYN patient.   OBJECTIVE:  There were no vitals taken for this visit.  Appears well, in no apparent distress  Injection administered in: Left deltoid  Meds ordered this encounter  Medications   medroxyPROGESTERone  Acetate SUSY 150 mg    ASSESSMENT: GYN patient Depo Provera  for contraception/period management PLAN: Follow-up: in 11-13 weeks for next Depo   Alan LITTIE Fischer  02/07/2024 3:51 PM

## 2024-04-14 ENCOUNTER — Other Ambulatory Visit: Payer: Self-pay | Admitting: Adult Health

## 2024-05-01 ENCOUNTER — Ambulatory Visit

## 2024-05-01 DIAGNOSIS — Z3042 Encounter for surveillance of injectable contraceptive: Secondary | ICD-10-CM

## 2024-05-01 MED ORDER — MEDROXYPROGESTERONE ACETATE 150 MG/ML IM SUSY
150.0000 mg | PREFILLED_SYRINGE | Freq: Once | INTRAMUSCULAR | Status: AC
  Administered 2024-05-01: 150 mg via INTRAMUSCULAR

## 2024-05-01 NOTE — Progress Notes (Signed)
   NURSE VISIT- INJECTION  SUBJECTIVE:  Brittany Mayo is a 28 y.o. G19P1001 female here for a Depo Provera  for contraception/period management. She is a GYN patient.   OBJECTIVE:  There were no vitals taken for this visit.  Appears well, in no apparent distress  Injection administered in: Right deltoid  Meds ordered this encounter  Medications   medroxyPROGESTERone  Acetate SUSY 150 mg    ASSESSMENT: GYN patient Depo Provera  for contraception/period management PLAN: Follow-up: in 11-13 weeks for next Depo   Aleck FORBES Blase  05/01/2024 3:01 PM

## 2024-07-24 ENCOUNTER — Ambulatory Visit: Payer: Self-pay
